# Patient Record
Sex: Male | Born: 1935 | Race: White | Hispanic: No | Smoking: Former smoker
Health system: Southern US, Community
[De-identification: ages and names within clinical notes are randomized; demographics above are authoritative.]

## PROBLEM LIST (undated history)

## (undated) DIAGNOSIS — Z8719 Personal history of other diseases of the digestive system: Secondary | ICD-10-CM

## (undated) DIAGNOSIS — R399 Unspecified symptoms and signs involving the genitourinary system: Secondary | ICD-10-CM

## (undated) DIAGNOSIS — I440 Atrioventricular block, first degree: Secondary | ICD-10-CM

## (undated) DIAGNOSIS — G4733 Obstructive sleep apnea (adult) (pediatric): Secondary | ICD-10-CM

## (undated) DIAGNOSIS — Z9989 Dependence on other enabling machines and devices: Secondary | ICD-10-CM

## (undated) DIAGNOSIS — I10 Essential (primary) hypertension: Secondary | ICD-10-CM

## (undated) DIAGNOSIS — M1990 Unspecified osteoarthritis, unspecified site: Secondary | ICD-10-CM

## (undated) DIAGNOSIS — E785 Hyperlipidemia, unspecified: Secondary | ICD-10-CM

## (undated) DIAGNOSIS — E039 Hypothyroidism, unspecified: Secondary | ICD-10-CM

## (undated) DIAGNOSIS — I2510 Atherosclerotic heart disease of native coronary artery without angina pectoris: Secondary | ICD-10-CM

## (undated) DIAGNOSIS — N400 Enlarged prostate without lower urinary tract symptoms: Secondary | ICD-10-CM

## (undated) DIAGNOSIS — Z951 Presence of aortocoronary bypass graft: Secondary | ICD-10-CM

## (undated) DIAGNOSIS — D519 Vitamin B12 deficiency anemia, unspecified: Secondary | ICD-10-CM

## (undated) DIAGNOSIS — K5909 Other constipation: Secondary | ICD-10-CM

## (undated) DIAGNOSIS — J449 Chronic obstructive pulmonary disease, unspecified: Secondary | ICD-10-CM

## (undated) DIAGNOSIS — N281 Cyst of kidney, acquired: Secondary | ICD-10-CM

## (undated) DIAGNOSIS — I4510 Unspecified right bundle-branch block: Secondary | ICD-10-CM

## (undated) DIAGNOSIS — C679 Malignant neoplasm of bladder, unspecified: Secondary | ICD-10-CM

## (undated) HISTORY — PX: APPENDECTOMY: SHX54

## (undated) HISTORY — PX: CARDIOVASCULAR STRESS TEST: SHX262

## (undated) HISTORY — PX: TONSILLECTOMY: SUR1361

## (undated) HISTORY — PX: CORONARY ARTERY BYPASS GRAFT: SHX141

## (undated) HISTORY — PX: HEMORRHOID SURGERY: SHX153

---

## 1969-06-04 HISTORY — PX: HIATAL HERNIA REPAIR: SHX195

## 2000-10-04 HISTORY — PX: CHOLECYSTECTOMY: SHX55

## 2009-10-04 HISTORY — PX: CATARACT EXTRACTION W/ INTRAOCULAR LENS  IMPLANT, BILATERAL: SHX1307

## 2015-08-05 ENCOUNTER — Encounter: Payer: Self-pay | Admitting: Emergency Medicine

## 2015-08-05 ENCOUNTER — Emergency Department
Admission: EM | Admit: 2015-08-05 | Discharge: 2015-08-05 | Disposition: A | Discharge status: Home / Self Care | Payer: Medicare Other | Attending: Emergency Medicine | Admitting: Emergency Medicine

## 2015-08-05 DIAGNOSIS — K5900 Constipation, unspecified: Secondary | ICD-10-CM | POA: Diagnosis present

## 2015-08-05 DIAGNOSIS — K5641 Fecal impaction: Secondary | ICD-10-CM | POA: Diagnosis not present

## 2015-08-05 HISTORY — DX: Atherosclerotic heart disease of native coronary artery without angina pectoris: I25.10

## 2015-08-05 MED ORDER — POLYETHYLENE GLYCOL 3350 17 G PO PACK: 17.0000 g | PACK | Freq: Every day | ORAL | Status: DC | PRN

## 2015-08-05 NOTE — Discharge Instructions (Signed)
Fecal Impaction °A fecal impaction happens when there is a large, firm amount of stool (or feces) that cannot be passed. The impacted stool is usually in the rectum, which is the lowest part of the large bowel. The impacted stool can block the colon and cause significant problems. °CAUSES  °The longer stool stays in the rectum, the harder it gets. Anything that slows down your bowel movements can lead to fecal impaction, such as: °· Constipation. This can be a long-standing (chronic) problem or can happen suddenly (acute). °· Painful conditions of the rectum, such as hemorrhoids or anal fissures. The pain of these conditions can make you try to avoid having bowel movements. °· Narcotic pain-relieving medicines, such as methadone, morphine, or codeine. °· Not drinking enough fluids. °· Inactivity and bed rest over long periods of time. °· Diseases of the brain or nervous system that damage the nerves controlling the muscles of the intestines. °SIGNS AND SYMPTOMS  °· Lack of normal bowel movements or changes in bowel patterns. °· Sense of fullness in the rectum but unable to pass stool. °· Pain or cramps in the abdominal area (often after meals). °· Thin, watery discharge from the rectum. °DIAGNOSIS  °Your health care provider may suspect that you have a fecal impaction based on your symptoms and a physical exam. This will include an exam of your rectum. Sometimes X-rays or lab testing may be needed to confirm the diagnosis and to be sure there are no other problems.  °TREATMENT  °· Initially an impaction can be removed manually. Using a gloved finger, your health care provider can remove hard stool from your rectum. °· Medicine is sometimes needed. A suppository or enema can be given in the rectum to soften the stool, which can stimulate a bowel movement. Medicines can also be given by mouth (orally). °· Though rare, surgery may be needed if the colon has torn (perforated) due to blockage. °HOME CARE INSTRUCTIONS   °· Develop regular bowel habits. This could include getting in the habit of having a bowel movement after your morning cup of coffee or after eating. Be sure to allow yourself enough time on the toilet. °· Maintain a high-fiber diet. °· Drink enough fluids to keep your urine clear or pale yellow as directed by your health care provider. °· Exercise regularly. °· If you begin to get constipated, increase the amount of fiber in your diet. Eat plenty of fruits, vegetables, whole wheat breads, bran, oatmeal, and similar products. °· Take natural fiber laxatives or other laxatives only as directed by your health care provider. °SEEK MEDICAL CARE IF:  °· You have ongoing rectal pain. °· You require enemas or suppositories more than twice a week. °· You have rectal bleeding. °· You have continued problems, or you develop abdominal pain. °· You have thin, pencil-like stools. °SEEK IMMEDIATE MEDICAL CARE IF:  °You have black or tarry stools. °MAKE SURE YOU:  °· Understand these instructions. °· Will watch your condition. °· Will get help right away if you are not doing well or get worse. °  °This information is not intended to replace advice given to you by your health care provider. Make sure you discuss any questions you have with your health care provider. °  °Document Released: 06/12/2004 Document Revised: 07/11/2013 Document Reviewed: 03/27/2013 °Elsevier Interactive Patient Education ©2016 [employer] ° °

## 2015-08-05 NOTE — ED Provider Notes (Signed)
[facility] Department Provider Note  ____________________________________________  Time seen: Approximately 220 PM  I have reviewed the triage vital signs and the nursing notes.   HISTORY  Chief Complaint Constipation    HPI Jack Castillo is a 81 y.o. male with a history of constipation on Colace who is presenting today without having a normal bowel movement for the past 2 days. He says that he is still able to pass gasand was able to pass some small pellet-like stools. However, he has not able to have his normal bowel movements which she usually has once a day. He says that about a week ago he started taking 250 mg of Colace at night. He is using a [employer] brand. He said this was a change from his usual routine of the twice a day Colace. He denies any change in his diet. He says that he does not really drink water but tracks mostly Gatorade. He denies any nausea vomiting or abdominal pain. He also denies any pain in his rectum. Denies any bright red blood per rectum. He also tried a warm water enema at home today which did not provide him any relief. He said that years ago he had to go to the emergency department [place] where he used to live for constipation where he received an enema which relieved his symptoms.   Past Medical History  Diagnosis Date  . Coronary artery disease   . Hernia, hiatal     There are no active problems to display for this patient.   Past Surgical History  Procedure Laterality Date  . Appendectomy    . Tonsillectomy    . Cardiac surgery    . Cholecystectomy      No current outpatient prescriptions on file.  Allergies Codeine  No family history on file.  Social History Social History  Substance Use Topics  . Smoking status: Never Smoker   . Smokeless tobacco: None  . Alcohol Use: 0.6 oz/week    1 Glasses of wine per week    Review of Systems Constitutional: No fever/chills Eyes: No  visual changes. ENT: No sore throat. Cardiovascular: Denies chest pain. Respiratory: Denies shortness of breath. Gastrointestinal: No abdominal pain.  No nausea, no vomiting.  No diarrhea.   Genitourinary: Negative for dysuria. Musculoskeletal: Negative for back pain. Skin: Negative for rash. Neurological: Negative for headaches, focal weakness or numbness.  10-point ROS otherwise negative.  ____________________________________________   PHYSICAL EXAM:  VITAL SIGNS: ED Triage Vitals  Enc Vitals Group     BP 08/05/15 1319 137/61 mmHg     Pulse Rate 08/05/15 1319 85     Resp 08/05/15 1319 18     Temp 08/05/15 1319 97.8 F (36.6 C)     Temp Source 08/05/15 1319 Oral     SpO2 08/05/15 1319 96 %     Weight 08/05/15 1313 208 lb (94.348 kg)     Height 08/05/15 1313 5\' 8"  (1.727 m)     Head Cir --      Peak Flow --      Pain Score 08/05/15 1314 0     Pain Loc --      Pain Edu? --      Excl. in [place]? --     Constitutional: Alert and oriented. Well appearing and in no acute distress. Eyes: Conjunctivae are normal. EOMI. Head: Atraumatic. Nose: No congestion/rhinnorhea. Mouth/Throat: Mucous membranes are moist.   Neck: No stridor.   Cardiovascular: Normal rate, regular  rhythm. Grossly normal heart sounds.  Good peripheral circulation. Respiratory: Normal respiratory effort.  No retractions. Lungs CTAB. Gastrointestinal: Soft and nontender. No distention. No CVA tenderness. Musculoskeletal: No lower extremity tenderness nor edema.  No joint effusions. Neurologic:  Normal speech and language. No gross focal neurologic deficits are appreciated. No gait instability. Skin:  Skin is warm, dry and intact. No rash noted. Psychiatric: Mood and affect are normal. Speech and behavior are normal.  ____________________________________________   LABS (all labs ordered are listed, but only abnormal results are displayed)  Labs Reviewed - No data to  display ____________________________________________  EKG   ____________________________________________  RADIOLOGY   ____________________________________________   PROCEDURES  ------------------------------------------------------------------------------------------------------------------- Fecal Disimpaction Procedure Note:  Performed by me:  Patient placed in the lateral recumbent position with knees drawn towards chest.  Small to moderate amount of hard stool high up in the rectum. Was able to remove a small amount. The patient tolerated the procedure well. The stool was brown without any gross blood. No complications during procedure.   ------------------------------------------------------------------------------------------------------------------    ____________________________________________   INITIAL IMPRESSION / ASSESSMENT AND PLAN / ED COURSE  Pertinent labs & imaging results that were available during my care of the patient were reviewed by me and considered in my medical decision making (see chart for details).  ----------------------------------------- 2:56 PM on 08/05/2015 -----------------------------------------  The patient was able to have a moderate sized bowel movement on his own after I disimpacted him. Since he is on quite a high dose of Colace at home and is not providing any relief, I will switch him to MiraLAX.  We discussed return precautions such as those of a bowel obstruction including abdominal distention, pain nausea vomiting and not passing stool and gas. Patient understands the plan and is going to comply.  ____________________________________________   FINAL CLINICAL IMPRESSION(S) / ED DIAGNOSES  Fecal impaction.     Orbie Pyo, MD 08/05/15 [phone]

## 2015-08-05 NOTE — ED Notes (Signed)
Pt states he has not had a BM in 2 days, gave self an enemia today with no relief, denies any abd. Pain or n,v

## 2015-08-05 NOTE — ED Notes (Signed)
Pt to ED due to no Bowel movement in 2 days despite enema this morning.

## 2015-09-02 ENCOUNTER — Other Ambulatory Visit (HOSPITAL_COMMUNITY): Payer: Self-pay | Admitting: Urology

## 2015-09-02 DIAGNOSIS — N289 Disorder of kidney and ureter, unspecified: Secondary | ICD-10-CM

## 2015-09-05 ENCOUNTER — Other Ambulatory Visit: Payer: Self-pay | Admitting: Urology

## 2015-09-12 ENCOUNTER — Ambulatory Visit (HOSPITAL_COMMUNITY)
Admission: RE | Admit: 2015-09-12 | Discharge: 2015-09-12 | Disposition: A | Discharge status: Home / Self Care | Payer: Medicare Other | Source: Ambulatory Visit | Attending: Urology | Admitting: Urology

## 2015-09-12 DIAGNOSIS — N289 Disorder of kidney and ureter, unspecified: Secondary | ICD-10-CM | POA: Diagnosis not present

## 2015-09-12 MED ORDER — GADOBENATE DIMEGLUMINE 529 MG/ML IV SOLN
20.0000 mL | Freq: Once | INTRAVENOUS | Status: AC | PRN
  Administered 2015-09-12: 18 mL via INTRAVENOUS

## 2015-09-22 ENCOUNTER — Encounter (HOSPITAL_BASED_OUTPATIENT_CLINIC_OR_DEPARTMENT_OTHER): Payer: Self-pay | Admitting: *Deleted

## 2015-09-22 NOTE — Progress Notes (Addendum)
NPO AFTER MN.  ARRIVE AT 0930.  NEEDS ISTAT.  CURRENT EKG, LOV NOTE, AND STRESS TEST in epic under Care Everywhere tab.  WILL TAKE SYNTHROID AND DO SYMBICORT INHALER/ NEBULIZER AM DOS W/ SIPS OF WATER.

## 2015-09-25 ENCOUNTER — Encounter (HOSPITAL_BASED_OUTPATIENT_CLINIC_OR_DEPARTMENT_OTHER): Payer: Self-pay | Admitting: *Deleted

## 2015-09-25 ENCOUNTER — Encounter (HOSPITAL_BASED_OUTPATIENT_CLINIC_OR_DEPARTMENT_OTHER)
Admission: RE | Disposition: A | Discharge status: Home / Self Care | Payer: Self-pay | Source: Ambulatory Visit | Attending: Urology

## 2015-09-25 ENCOUNTER — Ambulatory Visit (HOSPITAL_BASED_OUTPATIENT_CLINIC_OR_DEPARTMENT_OTHER)
Admission: RE | Admit: 2015-09-25 | Discharge: 2015-09-25 | Disposition: A | Discharge status: Home / Self Care | Payer: Medicare Other | Source: Ambulatory Visit | Attending: Urology | Admitting: Urology

## 2015-09-25 ENCOUNTER — Ambulatory Visit (HOSPITAL_BASED_OUTPATIENT_CLINIC_OR_DEPARTMENT_OTHER): Payer: Medicare Other | Admitting: Anesthesiology

## 2015-09-25 DIAGNOSIS — E039 Hypothyroidism, unspecified: Secondary | ICD-10-CM | POA: Diagnosis not present

## 2015-09-25 DIAGNOSIS — N401 Enlarged prostate with lower urinary tract symptoms: Secondary | ICD-10-CM | POA: Diagnosis not present

## 2015-09-25 DIAGNOSIS — Z951 Presence of aortocoronary bypass graft: Secondary | ICD-10-CM | POA: Insufficient documentation

## 2015-09-25 DIAGNOSIS — N281 Cyst of kidney, acquired: Secondary | ICD-10-CM | POA: Insufficient documentation

## 2015-09-25 DIAGNOSIS — R3912 Poor urinary stream: Secondary | ICD-10-CM | POA: Insufficient documentation

## 2015-09-25 DIAGNOSIS — J45909 Unspecified asthma, uncomplicated: Secondary | ICD-10-CM | POA: Diagnosis not present

## 2015-09-25 DIAGNOSIS — D649 Anemia, unspecified: Secondary | ICD-10-CM | POA: Diagnosis not present

## 2015-09-25 DIAGNOSIS — Z9989 Dependence on other enabling machines and devices: Secondary | ICD-10-CM | POA: Insufficient documentation

## 2015-09-25 DIAGNOSIS — K449 Diaphragmatic hernia without obstruction or gangrene: Secondary | ICD-10-CM | POA: Diagnosis not present

## 2015-09-25 DIAGNOSIS — I2510 Atherosclerotic heart disease of native coronary artery without angina pectoris: Secondary | ICD-10-CM | POA: Diagnosis not present

## 2015-09-25 DIAGNOSIS — R3914 Feeling of incomplete bladder emptying: Secondary | ICD-10-CM | POA: Insufficient documentation

## 2015-09-25 DIAGNOSIS — G4730 Sleep apnea, unspecified: Secondary | ICD-10-CM | POA: Insufficient documentation

## 2015-09-25 DIAGNOSIS — D494 Neoplasm of unspecified behavior of bladder: Secondary | ICD-10-CM | POA: Diagnosis present

## 2015-09-25 DIAGNOSIS — Z87891 Personal history of nicotine dependence: Secondary | ICD-10-CM | POA: Insufficient documentation

## 2015-09-25 DIAGNOSIS — J449 Chronic obstructive pulmonary disease, unspecified: Secondary | ICD-10-CM | POA: Insufficient documentation

## 2015-09-25 DIAGNOSIS — E7800 Pure hypercholesterolemia, unspecified: Secondary | ICD-10-CM | POA: Diagnosis not present

## 2015-09-25 DIAGNOSIS — M1990 Unspecified osteoarthritis, unspecified site: Secondary | ICD-10-CM | POA: Diagnosis not present

## 2015-09-25 DIAGNOSIS — R350 Frequency of micturition: Secondary | ICD-10-CM | POA: Diagnosis not present

## 2015-09-25 DIAGNOSIS — C670 Malignant neoplasm of trigone of bladder: Secondary | ICD-10-CM | POA: Insufficient documentation

## 2015-09-25 DIAGNOSIS — I129 Hypertensive chronic kidney disease with stage 1 through stage 4 chronic kidney disease, or unspecified chronic kidney disease: Secondary | ICD-10-CM | POA: Insufficient documentation

## 2015-09-25 DIAGNOSIS — Z7982 Long term (current) use of aspirin: Secondary | ICD-10-CM | POA: Insufficient documentation

## 2015-09-25 DIAGNOSIS — R3915 Urgency of urination: Secondary | ICD-10-CM | POA: Diagnosis not present

## 2015-09-25 DIAGNOSIS — Z79899 Other long term (current) drug therapy: Secondary | ICD-10-CM | POA: Diagnosis not present

## 2015-09-25 DIAGNOSIS — R351 Nocturia: Secondary | ICD-10-CM | POA: Diagnosis not present

## 2015-09-25 DIAGNOSIS — N183 Chronic kidney disease, stage 3 (moderate): Secondary | ICD-10-CM | POA: Insufficient documentation

## 2015-09-25 DIAGNOSIS — Z7951 Long term (current) use of inhaled steroids: Secondary | ICD-10-CM | POA: Insufficient documentation

## 2015-09-25 HISTORY — PX: TRANSURETHRAL RESECTION OF BLADDER TUMOR WITH GYRUS (TURBT-GYRUS): SHX6458

## 2015-09-25 HISTORY — DX: Unspecified osteoarthritis, unspecified site: M19.90

## 2015-09-25 HISTORY — DX: Essential (primary) hypertension: I10

## 2015-09-25 HISTORY — DX: Atrioventricular block, first degree: I44.0

## 2015-09-25 HISTORY — DX: Unspecified right bundle-branch block: I45.10

## 2015-09-25 HISTORY — DX: Other constipation: K59.09

## 2015-09-25 HISTORY — DX: Dependence on other enabling machines and devices: Z99.89

## 2015-09-25 HISTORY — DX: Unspecified symptoms and signs involving the genitourinary system: R39.9

## 2015-09-25 HISTORY — DX: Vitamin B12 deficiency anemia, unspecified: D51.9

## 2015-09-25 HISTORY — DX: Presence of aortocoronary bypass graft: Z95.1

## 2015-09-25 HISTORY — DX: Chronic obstructive pulmonary disease, unspecified: J44.9

## 2015-09-25 HISTORY — DX: Benign prostatic hyperplasia without lower urinary tract symptoms: N40.0

## 2015-09-25 HISTORY — DX: Cyst of kidney, acquired: N28.1

## 2015-09-25 HISTORY — DX: Obstructive sleep apnea (adult) (pediatric): G47.33

## 2015-09-25 HISTORY — DX: Personal history of other diseases of the digestive system: Z87.19

## 2015-09-25 HISTORY — DX: Hypothyroidism, unspecified: E03.9

## 2015-09-25 HISTORY — DX: Hyperlipidemia, unspecified: E78.5

## 2015-09-25 LAB — POCT I-STAT 4, (NA,K, GLUC, HGB,HCT)
Glucose, Bld: 91 mg/dL (ref 65–99)
HCT: 35 % — ABNORMAL LOW (ref 39.0–52.0)
HEMOGLOBIN: 11.9 g/dL — AB (ref 13.0–17.0)
POTASSIUM: 3.9 mmol/L (ref 3.5–5.1)
Sodium: 142 mmol/L (ref 135–145)

## 2015-09-25 SURGERY — TRANSURETHRAL RESECTION OF BLADDER TUMOR WITH GYRUS (TURBT-GYRUS)
Anesthesia: General | Site: Bladder

## 2015-09-25 MED ORDER — FENTANYL CITRATE (PF) 100 MCG/2ML IJ SOLN
INTRAMUSCULAR | Status: AC
  Filled 2015-09-25: qty 2

## 2015-09-25 MED ORDER — CEFAZOLIN SODIUM-DEXTROSE 2-3 GM-% IV SOLR
INTRAVENOUS | Status: AC
  Filled 2015-09-25: qty 50

## 2015-09-25 MED ORDER — PROMETHAZINE HCL 25 MG/ML IJ SOLN
6.2500 mg | INTRAMUSCULAR | Status: DC | PRN
  Filled 2015-09-25: qty 1

## 2015-09-25 MED ORDER — DEXAMETHASONE SODIUM PHOSPHATE 10 MG/ML IJ SOLN
INTRAMUSCULAR | Status: AC
  Filled 2015-09-25: qty 1

## 2015-09-25 MED ORDER — ONDANSETRON HCL 4 MG/2ML IJ SOLN
INTRAMUSCULAR | Status: AC
  Filled 2015-09-25: qty 2

## 2015-09-25 MED ORDER — GLYCOPYRROLATE 0.2 MG/ML IJ SOLN
INTRAMUSCULAR | Status: AC
  Filled 2015-09-25: qty 1

## 2015-09-25 MED ORDER — GLYCOPYRROLATE 0.2 MG/ML IJ SOLN
INTRAMUSCULAR | Status: DC | PRN
  Administered 2015-09-25: .2 mg via INTRAVENOUS

## 2015-09-25 MED ORDER — LIDOCAINE HCL (CARDIAC) 20 MG/ML IV SOLN
INTRAVENOUS | Status: DC | PRN
  Administered 2015-09-25: 50 mg via INTRAVENOUS

## 2015-09-25 MED ORDER — FENTANYL CITRATE (PF) 100 MCG/2ML IJ SOLN
25.0000 ug | INTRAMUSCULAR | Status: DC | PRN
  Filled 2015-09-25: qty 1

## 2015-09-25 MED ORDER — CEFAZOLIN SODIUM-DEXTROSE 2-3 GM-% IV SOLR
2.0000 g | INTRAVENOUS | Status: AC
  Administered 2015-09-25: 2 g via INTRAVENOUS
  Filled 2015-09-25: qty 50

## 2015-09-25 MED ORDER — LACTATED RINGERS IV SOLN
INTRAVENOUS | Status: DC
  Administered 2015-09-25 (×2): via INTRAVENOUS
  Filled 2015-09-25: qty 1000

## 2015-09-25 MED ORDER — LIDOCAINE HCL (CARDIAC) 20 MG/ML IV SOLN
INTRAVENOUS | Status: AC
  Filled 2015-09-25: qty 5

## 2015-09-25 MED ORDER — ACETAMINOPHEN 500 MG PO TABS
500.0000 mg | ORAL_TABLET | Freq: Four times a day (QID) | ORAL | Status: DC | PRN
  Administered 2015-09-25: 500 mg via ORAL
  Filled 2015-09-25: qty 1

## 2015-09-25 MED ORDER — KETOROLAC TROMETHAMINE 30 MG/ML IJ SOLN
INTRAMUSCULAR | Status: DC | PRN
  Administered 2015-09-25: 30 mg via INTRAVENOUS

## 2015-09-25 MED ORDER — MELOXICAM 15 MG PO TABS: 15.0000 mg | ORAL_TABLET | Freq: Every day | ORAL | Status: AC

## 2015-09-25 MED ORDER — ONDANSETRON HCL 4 MG/2ML IJ SOLN
INTRAMUSCULAR | Status: DC | PRN
  Administered 2015-09-25: 4 mg via INTRAVENOUS

## 2015-09-25 MED ORDER — KETOROLAC TROMETHAMINE 30 MG/ML IJ SOLN
INTRAMUSCULAR | Status: AC
  Filled 2015-09-25: qty 1

## 2015-09-25 MED ORDER — SODIUM CHLORIDE 0.9 % IR SOLN
Status: DC | PRN
  Administered 2015-09-25: 3000 mL

## 2015-09-25 MED ORDER — ACETAMINOPHEN 500 MG PO TABS
ORAL_TABLET | ORAL | Status: AC
  Filled 2015-09-25: qty 1

## 2015-09-25 MED ORDER — EPHEDRINE SULFATE 50 MG/ML IJ SOLN
INTRAMUSCULAR | Status: DC | PRN
  Administered 2015-09-25: 10 mg via INTRAVENOUS

## 2015-09-25 MED ORDER — FENTANYL CITRATE (PF) 100 MCG/2ML IJ SOLN
INTRAMUSCULAR | Status: DC | PRN
  Administered 2015-09-25 (×4): 25 ug via INTRAVENOUS

## 2015-09-25 MED ORDER — PROPOFOL 10 MG/ML IV BOLUS
INTRAVENOUS | Status: AC
  Filled 2015-09-25: qty 20

## 2015-09-25 MED ORDER — EPHEDRINE SULFATE 50 MG/ML IJ SOLN
INTRAMUSCULAR | Status: AC
  Filled 2015-09-25: qty 1

## 2015-09-25 MED ORDER — TRIMETHOPRIM 100 MG PO TABS: 100.0000 mg | ORAL_TABLET | ORAL | Status: DC

## 2015-09-25 MED ORDER — STERILE WATER FOR IRRIGATION IR SOLN
Status: DC | PRN
  Administered 2015-09-25: 3000 mL

## 2015-09-25 MED ORDER — PROPOFOL 10 MG/ML IV BOLUS
INTRAVENOUS | Status: DC | PRN
  Administered 2015-09-25: 120 mg via INTRAVENOUS

## 2015-09-25 MED ORDER — UROGESIC-BLUE 81.6 MG PO TABS: ORAL_TABLET | ORAL | Status: AC

## 2015-09-25 MED ORDER — DEXAMETHASONE SODIUM PHOSPHATE 4 MG/ML IJ SOLN
INTRAMUSCULAR | Status: DC | PRN
  Administered 2015-09-25: 10 mg via INTRAVENOUS

## 2015-09-25 SURGICAL SUPPLY — 35 items
BAG DRAIN URO-CYSTO SKYTR STRL (DRAIN) ×2 IMPLANT
BAG URINE DRAINAGE (UROLOGICAL SUPPLIES) ×2 IMPLANT
BAG URINE LEG 19OZ MD ST LTX (BAG) IMPLANT
BOOTIES KNEE HIGH SLOAN (MISCELLANEOUS) ×2 IMPLANT
CATH FOLEY 2WAY SLVR  5CC 18FR (CATHETERS) ×1
CATH FOLEY 2WAY SLVR  5CC 20FR (CATHETERS)
CATH FOLEY 2WAY SLVR  5CC 22FR (CATHETERS)
CATH FOLEY 2WAY SLVR 5CC 18FR (CATHETERS) ×1 IMPLANT
CATH FOLEY 2WAY SLVR 5CC 20FR (CATHETERS) IMPLANT
CATH FOLEY 2WAY SLVR 5CC 22FR (CATHETERS) IMPLANT
CLOTH BEACON ORANGE TIMEOUT ST (SAFETY) ×2 IMPLANT
ELECT REM PT RETURN 9FT ADLT (ELECTROSURGICAL) ×4
ELECTRODE REM PT RTRN 9FT ADLT (ELECTROSURGICAL) ×2 IMPLANT
GLOVE BIO SURGEON STRL SZ 6.5 (GLOVE) ×2 IMPLANT
GLOVE BIO SURGEON STRL SZ7.5 (GLOVE) ×2 IMPLANT
GLOVE BIOGEL PI IND STRL 6.5 (GLOVE) ×1 IMPLANT
GLOVE BIOGEL PI INDICATOR 6.5 (GLOVE) ×1
GOWN STRL REUS W/ TWL LRG LVL3 (GOWN DISPOSABLE) ×1 IMPLANT
GOWN STRL REUS W/ TWL XL LVL3 (GOWN DISPOSABLE) ×1 IMPLANT
GOWN STRL REUS W/TWL LRG LVL3 (GOWN DISPOSABLE) ×1
GOWN STRL REUS W/TWL XL LVL3 (GOWN DISPOSABLE) ×1
HOLDER FOLEY CATH W/STRAP (MISCELLANEOUS) ×2 IMPLANT
IV NS 1000ML (IV SOLUTION) ×1
IV NS 1000ML BAXH (IV SOLUTION) ×1 IMPLANT
IV NS IRRIG 3000ML ARTHROMATIC (IV SOLUTION) ×2 IMPLANT
KIT ROOM TURNOVER WOR (KITS) ×2 IMPLANT
LOOP CUT BIPOLAR 24F LRG (ELECTROSURGICAL) ×2 IMPLANT
MANIFOLD NEPTUNE II (INSTRUMENTS) ×2 IMPLANT
PACK CYSTO (CUSTOM PROCEDURE TRAY) ×2 IMPLANT
PLUG CATH AND CAP STER (CATHETERS) IMPLANT
SET ASPIRATION TUBING (TUBING) IMPLANT
SYRINGE IRR TOOMEY STRL 70CC (SYRINGE) IMPLANT
TUBE CONNECTING 12X1/4 (SUCTIONS) ×2 IMPLANT
WATER STERILE IRR 3000ML UROMA (IV SOLUTION) ×2 IMPLANT
WATER STERILE IRR 500ML POUR (IV SOLUTION) ×2 IMPLANT

## 2015-09-25 NOTE — Transfer of Care (Signed)
Immediate Anesthesia Transfer of Care Note  Patient: Jack Castillo  Procedure(s) Performed: Procedure(s) (LRB): TRANSURETHRAL RESECTION OF BLADDER TUMOR WITH GYRUS (TURBT-GYRUS) X 2, 3 CM RIGHT BLADDER BASE. RIGHT BLADDER TRIGONE 2 CM (N/A)  Patient Location: PACU  Anesthesia Type: General  Level of Consciousness: awake, oriented, sedated and patient cooperative  Airway & Oxygen Therapy: Patient Spontanous Breathing and Patient connected to face mask oxygen  Post-op Assessment: Report given to PACU RN and Post -op Vital signs reviewed and stable  Post vital signs: Reviewed and stable  Complications: No apparent anesthesia complications

## 2015-09-25 NOTE — Anesthesia Preprocedure Evaluation (Signed)
Anesthesia Evaluation  Patient identified by MRN, date of birth, ID band Patient awake    Reviewed: Allergy & Precautions, NPO status , Patient's Chart, lab work & pertinent test results  Airway Mallampati: II  TM Distance: >3 FB Neck ROM: Full    Dental no notable dental hx.    Pulmonary sleep apnea and Continuous Positive Airway Pressure Ventilation , COPD,  COPD inhaler, former smoker,    Pulmonary exam normal breath sounds clear to auscultation       Cardiovascular Exercise Tolerance: Good hypertension, Pt. on medications + CAD  Normal cardiovascular exam+ dysrhythmias  Rhythm:Regular Rate:Normal     Neuro/Psych negative neurological ROS  negative psych ROS   GI/Hepatic Neg liver ROS, hiatal hernia,   Endo/Other  Hypothyroidism   Renal/GU Renal disease  negative genitourinary   Musculoskeletal  (+) Arthritis ,   Abdominal   Peds negative pediatric ROS (+)  Hematology  (+) anemia ,   Anesthesia Other Findings   Reproductive/Obstetrics negative OB ROS                             Anesthesia Physical Anesthesia Plan  ASA: III  Anesthesia Plan: General   Post-op Pain Management:    Induction: Intravenous  Airway Management Planned: LMA  Additional Equipment:   Intra-op Plan:   Post-operative Plan: Extubation in OR  Informed Consent: I have reviewed the patients History and Physical, chart, labs and discussed the procedure including the risks, benefits and alternatives for the proposed anesthesia with the patient or authorized representative who has indicated his/her understanding and acceptance.   Dental advisory given  Plan Discussed with: CRNA  Anesthesia Plan Comments:         Anesthesia Quick Evaluation

## 2015-09-25 NOTE — Interval H&P Note (Signed)
History and Physical Interval Note:  09/25/2015 10:14 AM  Jack Castillo  has presented today for surgery, with the diagnosis of BLADDER TUMOR  The various methods of treatment have been discussed with the patient and family. After consideration of risks, benefits and other options for treatment, the patient has consented to  Procedure(s): TRANSURETHRAL RESECTION OF BLADDER TUMOR WITH GYRUS (TURBT-GYRUS) (N/A) as a surgical intervention .  The patient's history has been reviewed, patient examined, no change in status, stable for surgery.  I have reviewed the patient's chart and labs.  Questions were answered to the patient's satisfaction.     Iden Stripling I Siraj Dermody

## 2015-09-25 NOTE — Anesthesia Procedure Notes (Signed)
Procedure Name: LMA Insertion Date/Time: 09/25/2015 12:07 PM Performed by: Denna Haggard D Pre-anesthesia Checklist: Patient identified, Emergency Drugs available, Suction available and Patient being monitored Patient Re-evaluated:Patient Re-evaluated prior to inductionOxygen Delivery Method: Circle System Utilized Preoxygenation: Pre-oxygenation with 100% oxygen Intubation Type: IV induction Ventilation: Mask ventilation without difficulty LMA: LMA inserted LMA Size: 5.0 Number of attempts: 1 Airway Equipment and Method: bite block Placement Confirmation: positive ETCO2 Tube secured with: Tape Dental Injury: Teeth and Oropharynx as per pre-operative assessment

## 2015-09-25 NOTE — Discharge Instructions (Addendum)
Bladder Cancer Bladder cancer is an abnormal growth of tissue in your bladder. Your bladder is the balloon-like sac in your pelvis. It collects and stores urine that comes from the kidneys through the ureters. The bladder wall is made of layers. If cancer spreads into these layers and through the wall of the bladder, it becomes more difficult to treat.  There are four stages of bladder cancer:  Stage I. Cancer at this stage occurs in the bladder's inner lining but has not invaded the muscular bladder wall.  Stage II. At this stage, cancer has invaded the bladder wall but is still confined to the bladder.  Stage III. By this stage, the cancer cells have spread through the bladder wall to surrounding tissue. They may also have spread to the prostate in men or the uterus or vagina in women.  Stage IV. By this stage, cancer cells may have spread to the lymph nodes and other organs, such as your lungs, bones, or liver. RISK FACTORS Although the cause of bladder cancer is not known, the following risk factors can increase your chances of getting bladder cancer:   Smoking.   Occupational exposures, such as rubber, leather, textile, dyes, chemicals, and paint.  Being white.  Age.   Being male.   Having chronic bladder inflammation.   Having a bladder cancer history.   Having a family history of bladder cancer (heredity).   Having had chemotherapy or radiation therapy to the pelvis.   Being exposed to arsenic.  SYMPTOMS   Blood in the urine.   Pain with urination.   Frequent bladder or urine infections.  Increase in urgency and frequency of urination. DIAGNOSIS  Your health care provider may suspect bladder cancer based on your description of urinary symptoms or based on the finding of blood or infection in the urine (especially if this has recurred several times). Other tests or procedures that may be performed include:   A narrow tube being inserted into your bladder  through your urethra (cystoscopy) in order to view the lining of your bladder for tumors.   A biopsy to sample the tumor to see if cancer is present.  If cancer is present, it will then be staged to determine its severity and extent. It is important to know how deeply into the bladder wall the cancer has grown and whether the cancer has spread to any other parts of your body. Staging may require blood tests or special scans such as a CT scan, MRI, bone scan, or chest X-ray.  TREATMENT  Once your cancer has been diagnosed and staged, you should discuss a treatment plan with your health care provider. Based on the stage of the cancer, one treatment or a combination of treatments may be recommended. The most common forms of treatment are:   Surgery. Procedures that may be done include transurethral resection and cystectomy.  Radiation therapy. This is infrequently used to treat bladder cancer.   Chemotherapy. During this treatment, drugs are used to kill cancer cells.  Immunotherapy. This is usually administered directly into the bladder. HOME CARE INSTRUCTIONS  Take medicines only as directed by your health care provider.   Maintain a healthy diet.   Consider joining a support group. This may help you learn to cope with the stress of having bladder cancer.   Seek advice to help you manage treatment side effects.   Keep all follow-up visits as directed by your health care provider.   Inform your cancer specialist if you are  admitted to the hospital.  [place] IF:  There is blood in your urine.  You have symptoms of a urinary tract infection. These include:  Tiredness.  Shakiness.  Weakness.  Muscle aches.  Abdominal pain.  Frequent and intense urge to urinate (in young women).  Burning feeling in the bladder or urethra during urination (in young women).                                                                 Transurethral Resection, Bladder Tumor    A cancerous growth (tumor) can develop on the inside wall of the bladder. The bladder is the organ that holds urine. One way to remove the tumor is a procedure called a transurethral resection. The tumor is removed (resected) through the tube that carries urine from the bladder out of the body (urethra). No cuts (incisions) are made in the skin. Instead, the procedure is done through a thin telescope, called a resectoscope. Attached to it is a light and usually a tiny camera. The resectoscope is put into the urethra. In men, the urethra opens at the end of the penis. In women, it opens just above the vagina.  A transurethral resection is usually used to remove tumors that have not gotten too big or too deep. These are called Stage 0, Stage 1 or Stage 2 bladder cancers. LET YOUR CAREGIVER KNOW ABOUT:  On the day of the procedure, your caregivers will need to know the last time you had anything to eat or drink. This includes water, gum, and candy. In advance, make sure they know about:   Any allergies.  All medications you are taking, including:  Herbs, eyedrops, over-the-counter medications and creams.  Blood thinners (anticoagulants), aspirin or other drugs that could affect blood clotting.  Use of steroids (by mouth or as creams).  Previous problems with anesthetics, including local anesthetics.  Possibility of pregnancy, if this applies.  Any history of blood clots.  Any history of bleeding or other blood problems.  Previous surgery.  Smoking history.  Any recent symptoms of colds or infections.  Other health problems. RISKS AND COMPLICATIONS This is usually a safe procedure. Every procedure has risks, though. For a transurethral resection, they include:  Infection. Antibiotic medication would need to be taken.  Bleeding.  Light bleeding may last for several days after the procedure.  If bleeding continues or is heavy, the bladder may need rinsing. Or, a new catheter  might be put in for awhile.  Sometimes bed rest is needed.  Urination problems.  Pain and burning can occur when urinating. This usually goes away in a few days.  Scarring from the procedure can block the flow of urine.  Bladder damage.  It can be punctured or torn during removal of the tumor. If this happens, a catheter might be needed for longer. Antibiotics would be taken while the bladder heals.  Urine can leak through the hole or tear into the abdomen. If this happens, surgery may be needed to repair the bladder. BEFORE THE PROCEDURE   A medical evaluation will be done. This may include:  A physical examination.  Urine test. This is to make sure you do not have a urinary tract infection.  Blood tests.  A test  that checks the heart's rhythm (electrocardiogram).  Talking with an anesthesiologist. This is the person who will be in charge of the medication (anesthesia) to keep you from feeling pain during the transurethral resection. You might be asleep during the procedure (general anesthesia) or numb from the waist down, but awake during the procedure (spinal anesthesia). Ask your surgeon what to expect.  The person who is having a transurethral resection needs to give what is called informed consent. This requires signing a legal paper that gives permission for the procedure. To give informed consent:  You must understand how the procedure is done and why.  You must be told all the risks and benefits of the procedure.  You must sign the consent. Sometimes a legal guardian can do this.  Signing should be witnessed by a healthcare professional.  The day before the surgery, eat only a light dinner. Then, do not eat or drink anything for at least 8 hours before the surgery. Ask your caregiver if it is OK to take any needed medicines with a sip of water.  Arrive at least an hour before the surgery or whenever your surgeon recommends. This will give you time to check in and  fill out any needed paperwork. PROCEDURE  The preparation:  You will change into a hospital gown.  A needle will be inserted in your arm. This is an intravenous access tube (IV). Medication will be able to flow directly into your body through this needle.  Small monitors will be put on your body. They are used to check your heart, blood pressure, and oxygen level.  You might be given medication that will help you relax (sedative).  You will be given a general anesthetic or spinal anesthesia.  The procedure:  Once you are asleep or numb from the waist down, your legs will be placed in stirrups.  The resectoscope will be passed through the urethra into the bladder.  Fluid will be passed through the resectoscope. This will fill the bladder with water.  The surgeon will examine the bladder through the scope. If the scope has a camera, it can take pictures from inside the bladder. They can be projected onto a TV screen.  The surgeon will use various tools to remove the tumor in small pieces. Sometimes a laser (a beam of light energy) is used. Other tools may use electric current.  A tube (catheter) will often be placed so that urine can drain into a bag outside the body. This process helps stop bleeding. This tube keeps blood clots from blocking the urethra.  The procedure usually takes 30 to 45 minutes. AFTER THE PROCEDURE   You will stay in a recovery area until the anesthesia has worn off. Your blood pressure and pulse will be checked every so often. Then you will be taken to a hospital room.  You may continue to get fluids through the IV for awhile.  Some pain is normal. The catheter might be uncomfortable. Pain is usually not severe. If it is, ask for pain medicine.  Your urine may look bloody after a transurethral resection. This is normal.  If bleeding is heavy, a hospital caregiver may rinse out the bladder (irrigation) through the catheter.  Once the urine is clear, the  catheter will be taken out.  You will need to stay in the hospital until you can urinate on your own.  Most people stay in the hospital for up to 4 days. PROGNOSIS   Transurethral resection is considered  the best way to treat bladder tumors that are not too far along. For most people, the treatment is successful. Sometimes, though, more treatment is needed.  Bladder cancers can come back even after a successful procedure. Because of this, be sure to have a checkup with your caregiver every 3 to 6 months. If everything is OK for 3 years, you can reduce the checkups to once a year.   This information is not intended to replace advice given to you by your health care provider. Make sure you discuss any questions you have with your health care provider.   Post Anesthesia Home Care Instructions  Activity: Get plenty of rest for the remainder of the day. A responsible adult should stay with you for 24 hours following the procedure.  For the next 24 hours, DO NOT: -Drive a car -Paediatric nurse -Drink alcoholic beverages -Take any medication unless instructed by your physician -Make any legal decisions or sign important papers.  Meals: Start with liquid foods such as gelatin or soup. Progress to regular foods as tolerated. Avoid greasy, spicy, heavy foods. If nausea and/or vomiting occur, drink only clear liquids until the nausea and/or vomiting subsides. Call your physician if vomiting continues.  Special Instructions/Symptoms: Your throat may feel dry or sore from the anesthesia or the breathing tube placed in your throat during surgery. If this causes discomfort, gargle with warm salt water. The discomfort should disappear within 24 hours.  If you had a scopolamine patch placed behind your ear for the management of post- operative nausea and/or vomiting:  1. The medication in the patch is effective for 72 hours, after which it should be removed.  Wrap patch in a tissue and discard in  the trash. Wash hands thoroughly with soap and water. 2. You may remove the patch earlier than 72 hours if you experience unpleasant side effects which may include dry mouth, dizziness or visual disturbances. 3. Avoid touching the patch. Wash your hands with soap and water after contact with the patch.                                                                                                                                 Foley Catheter Care  A Foley catheter is a soft, flexible tube that is placed into the bladder to drain urine. A Foley catheter may be inserted if:  You leak urine or are not able to control when you urinate (urinary incontinence).  You are not able to urinate when you need to (urinary retention).  You had prostate surgery or surgery on the genitals.  You have certain medical conditions, such as multiple sclerosis, dementia, or a spinal cord injury. If you are going home with a Foley catheter in place, follow the instructions below. TAKING CARE OF THE CATHETER  Wash your hands with soap and water.  Using mild soap and warm water on a clean washcloth:  Clean the area on  your body closest to the catheter insertion site using a circular motion, moving away from the catheter. Never wipe toward the catheter because this could sweep bacteria up into the urethra and cause infection.  Remove all traces of soap. Pat the area dry with a clean towel. For males, reposition the foreskin.  Attach the catheter to your leg so there is no tension on the catheter. Use adhesive tape or a leg strap. If you are using adhesive tape, remove any sticky residue left behind by the previous tape you used.  Keep the drainage bag below the level of the bladder, but keep it off the floor.  Check throughout the day to be sure the catheter is working and urine is draining freely. Make sure the tubing does not become kinked.  Do not pull on the catheter or try to remove it. Pulling could  damage internal tissues. TAKING CARE OF THE DRAINAGE BAGS You will be given two drainage bags to take home. One is a large overnight drainage bag, and the other is a smaller leg bag that fits underneath clothing. You may wear the overnight bag at any time, but you should never wear the smaller leg bag at night. Follow the instructions below for how to empty, change, and clean your drainage bags. Emptying the Drainage Bag You must empty your drainage bag when it is  - full or at least 2-3 times a day.  Wash your hands with soap and water.  Keep the drainage bag below your hips, below the level of your bladder. This stops urine from going back into the tubing and into your bladder.  Hold the dirty bag over the toilet or a clean container.  Open the pour spout at the bottom of the bag and empty the urine into the toilet or container. Do not let the pour spout touch the toilet, container, or any other surface. Doing so can place bacteria on the bag, which can cause an infection.  Clean the pour spout with a gauze pad or cotton ball that has rubbing alcohol on it.  Close the pour spout.  Attach the bag to your leg with adhesive tape or a leg strap.  Wash your hands well. Changing the Drainage Bag Change your drainage bag once a month or sooner if it starts to smell bad or look dirty. Below are steps to follow when changing the drainage bag.  Wash your hands with soap and water.  Pinch off the rubber catheter so that urine does not spill out.  Disconnect the catheter tube from the drainage tube at the connection valve. Do not let the tubes touch any surface.  Clean the end of the catheter tube with an alcohol wipe. Use a different alcohol wipe to clean the end of the drainage tube.  Connect the catheter tube to the drainage tube of the clean drainage bag.  Attach the new bag to the leg with adhesive tape or a leg strap. Avoid attaching the new bag too tightly.  Wash your hands  well. Cleaning the Drainage Bag  Wash your hands with soap and water.  Wash the bag in warm, soapy water.  Rinse the bag thoroughly with warm water.  Fill the bag with a solution of white vinegar and water (1 cup vinegar to 1 qt warm water [.2 L vinegar to 1 L warm water]). Close the bag and soak it for 30 minutes in the solution.  Rinse the bag with warm water.  Hang the  bag to dry with the pour spout open and hanging downward.  Store the clean bag (once it is dry) in a clean plastic bag.  Wash your hands well. PREVENTING INFECTION  Wash your hands before and after handling your catheter.  Take showers daily and wash the area where the catheter enters your body. Do not take baths. Replace wet leg straps with dry ones, if this applies.  Do not use powders, sprays, or lotions on the genital area. Only use creams, lotions, or ointments as directed by your caregiver.  For females, wipe from front to back after each bowel movement.  Drink enough fluids to keep your urine clear or pale yellow unless you have a fluid restriction.  Do not let the drainage bag or tubing touch or lie on the floor.  Wear cotton underwear to absorb moisture and to keep your skin drier. SEEK MEDICAL CARE IF:   Your urine is cloudy or smells unusually bad.  Your catheter becomes clogged.  You are not draining urine into the bag or your bladder feels full.  Your catheter starts to leak. SEEK IMMEDIATE MEDICAL CARE IF:   You have pain, swelling, redness, or pus where the catheter enters the body.  You have pain in the abdomen, legs, lower back, or bladder.  You have a fever.  You see blood fill the catheter, or your urine is pink or red.  You have nausea, vomiting, or chills.  Your catheter gets pulled out. MAKE SURE YOU:   Understand these instructions.  Will watch your condition.  Will get help right away if you are not doing well or get worse.   This information is not intended to  replace advice given to you by your health care provider. Make sure you discuss any questions you have with your health care provider.   Document Released: 09/20/2005 Document Revised: 02/04/2014 Document Reviewed: 09/11/2012 Elsevier Interactive Patient Education [employer].

## 2015-09-25 NOTE — H&P (Signed)
Reason For Visit Review MRI & PUS   Active Problems Problems  1. Benign prostatic hypertrophy with weak urinary stream (N40.1,R39.12)   Assessed By: Carolan Clines (Urology); Last Assessed: 05 Sep 2015 2. Bladder Mass (___ Cm)   Assessed By: Carolan Clines (Urology); Last Assessed: 01 Sep 2015 3. Microscopic hematuria (R31.29) 4. Renal lesion (N28.9) 5. Stage III chronic kidney disease (N18.3)   Assessed By: Carolan Clines (Urology); Last Assessed: 15 Sep 2015  History of Present Illness    81 yo male returns today to review MRI & PUS. Originally referred by Dr. Vickki Muff for further evaluation of BPH, failing Tamsulosin. He has an 30 pk/yr hx of smoking. IPSS= 16/7. Urinary frequency, weak stream, and nocturia. 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 Cr. 1.31; gfr 49 (CKD=3A)     U/a showed significant microhematuria, and cytologies were abnormal. FISH showed TDD, and CT hematuria protocol shows bladder abnormalities. RLP abnormality is seen and MRI is recommended to R/o RCC. 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 He is scheduled for cysto/TURBT on 09/25/15.     09/05/15 Prostate u/s:  L: 4.39, H: 32.21, w: 3.89. volume 28.71cc.   Past Medical History Problems  1. History of arthritis (Z87.39) 2. History of asthma (Z87.09) 3. History of hypercholesterolemia (Z86.39) 4. History of hypertension (Z86.79) 5. History of sleep apnea (Z87.09)  Surgical History Problems  1. History of Adenoidectomy 2. History of CABG 3. History of Cholecystectomy 4. History of Hernia Repair 5. History of Tonsillectomy  Current Meds 1. Albuterol Sulfate (2.5 MG/3ML) 0.083% Inhalation Nebulization Solution;  Therapy: (Recorded:04Nov2016) to Recorded 2. Aspirin 81 MG TABS;  Therapy: (Recorded:04Nov2016) to Recorded 3. Atorvastatin Calcium 20 MG Oral Tablet;  Therapy: (Recorded:04Nov2016) to Recorded 4. Co Q 10 CAPS;  Therapy: (Recorded:04Nov2016) to Recorded 5.  Colace CAPS;  Therapy: (Recorded:04Nov2016) to Recorded 6. Finasteride 5 MG Oral Tablet;  Therapy: (Recorded:04Nov2016) to Recorded 7. Fish Oil CAPS;  Therapy: (Recorded:04Nov2016) to Recorded 8. Irbesartan-Hydrochlorothiazide 150-12.5 MG Oral Tablet;  Therapy: (Recorded:04Nov2016) to Recorded 9. Levothyroxine Sodium TABS;  Therapy: (Recorded:04Nov2016) to Recorded 10. Melatonin TABS;   Therapy: (Recorded:04Nov2016) to Recorded 11. Osteo-Bi-Flex TABS;   Therapy: (Recorded:04Nov2016) to Recorded 12. Symbicort 160-4.5 MCG/ACT Inhalation Aerosol;   Therapy: (Recorded:04Nov2016) to Recorded 13. Tamsulosin HCl - 0.4 MG Oral Capsule;   Therapy: (Recorded:04Nov2016) to Recorded  Allergies Medication  1. Codeine Derivatives  Family History Problems  1. Family history of Heart trouble : Father  Social History Problems  1. Alcohol use (Z78.9)   1-2 2. Denied: History of Caffeine use 3. Former smoker [phone])   1/2 ppd/ 25 year; quit 1976 4. Retired  Review of Systems Genitourinary, constitutional, skin, eye, otolaryngeal, hematologic/lymphatic, cardiovascular, pulmonary, endocrine, musculoskeletal, gastrointestinal, neurological and psychiatric system(s) were reviewed and pertinent findings if present are noted and are otherwise negative.  Genitourinary: urinary frequency, feelings of urinary urgency, nocturia, weak urinary stream, urinary stream starts and stops, incomplete emptying of bladder, erectile dysfunction and initiating urination requires straining.  Gastrointestinal: diarrhea.  Integumentary: pruritus.  Hematologic/Lymphatic: a tendency to easily bruise.  Cardiovascular: leg swelling.  Respiratory: shortness of breath.  Psychiatric: anxiety.    Vitals Vital Signs [Data Includes: Last 1 Day]  Recorded: 12Dec2016 04:22PM  Blood Pressure: 146 / 58 Temperature: 97.6 F Heart Rate: 48  Physical Exam Constitutional: Well nourished and well developed . No acute  distress.  ENT:. The ears and nose are normal in appearance.  Neck: The appearance of the neck is normal.  Pulmonary: No respiratory distress and normal respiratory rhythm and effort.  Cardiovascular: Heart rate and rhythm are normal . No peripheral edema.  Abdomen: The abdomen is mildly obese, but not distended. The abdomen is soft and nontender. No masses are palpated. No CVA tenderness. No hernias are palpable. No hepatosplenomegaly noted.  Rectal: Rectal exam demonstrates normal sphincter tone, the anus is normal on inspection., no tenderness and no masses. Estimated prostate size is 4+. The prostate has no nodularity and is not tender. The left seminal vesicle is nonpalpable. The right seminal vesicle is nonpalpable. The perineum is normal on inspection.  Genitourinary: Examination of the penis demonstrates no discharge, no masses, no lesions and a normal meatus. The penis is uncircumcised. The scrotum is without lesions. The right epididymis is palpably normal and non-tender. The left epididymis is palpably normal and non-tender. The right testis is palpably normal, non-tender and without masses. The left testis is normal, non-tender and without masses.  Lymphatics: The femoral and inguinal nodes are not enlarged or tender.  Skin: Normal skin turgor, no visible rash and no visible skin lesions.  Neuro/Psych:. Mood and affect are appropriate.    Assessment Assessed  1. Stage III chronic kidney disease (N18.3) 2. Bladder cancer (C67.9) 3. 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 Microscopic hematuria (R31.29) 5. Multiple renal cysts (Q61.02)  Family conference. ( 45 min)  Pt has solitary tumor on right side of the bladder, 2cm. He will have TUR-BT and Mitomycin-C His kidney is a benign cyst, B-II.   Plan Continue with plans for TUR-BT and bladder wash chemotherapy.   Signatures Electronically signed by : Carolan Clines, M.D.; Sep 15 2015  5:10PM EST

## 2015-09-25 NOTE — Op Note (Signed)
Pre-operative diagnosis :   Multiple bladder tumors  Postoperative diagnosis:  3 cm right bladder base bladder tumor and 2 cm right trigonal bladder tumor  Operation cystourethroscopy, transurethral resection of multiple bladder tumors   Surgeon:  S. Gaynelle Arabian, MD  First assistant:  None  Anesthesia:  General LMA  Preparation:  After appropriate preanesthesia, the patient was brought to the operating and placed on the operating table in the dorsal supine position where general LMA anesthesia was introduced. He was then replaced and aortic dorsal lithotomy position with the pubis was prepped with Betadine solution and draped in usual fashion. The arm was double checked. The history was double checked.  Review history:  Problems  1. Benign prostatic hypertrophy with weak urinary stream (N40.1,R39.12)  Assessed By: Carolan Clines (Urology); Last Assessed: 05 Sep 2015 2. Bladder Mass (___ Cm)  Assessed By: Carolan Clines (Urology); Last Assessed: 01 Sep 2015 3. Microscopic hematuria (R31.29) 4. Renal lesion (N28.9) 5. Stage III chronic kidney disease (N18.3)  Assessed By: Carolan Clines (Urology); Last Assessed: 15 Sep 2015  History of Present Illness   81 yo male returns today to review MRI & PUS. Originally referred by Dr. Vickki Muff for further evaluation of BPH, failing Tamsulosin. He has an 30 pk/yr hx of smoking. IPSS= 16/7. Urinary frequency, weak stream, and nocturia. 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 Cr. 1.31; gfr 49 (CKD=3A)    U/a showed significant microhematuria, and cytologies were abnormal. FISH showed TDD, and CT hematuria protocol shows bladder abnormalities. RLP abnormality is seen and MRI is recommended to R/o RCC. 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 He is scheduled for cysto/TURBT on 09/25/15.     09/05/15 Prostate u/s: L: 4.39, H: 32.21, w: 3.89. volume 28.71cc.   Past Medical History Problems  1. History of arthritis  (Z87.39) 2. History of asthma (Z87.09) 3. History of hypercholesterolemia (Z86.39) 4. History of hypertension (Z86.79) 5. History of sleep apnea (Z87.09)  Statement of  Likelihood of Success: Excellent. TIME-OUT observed.:  Procedure:  Cystourethroscopy was accomplished and showed bilobar BPH and normal-appearing urethra. Upon entering the bladder, I'm easily encountered to latter tumors on the right side of the bladder. There was a IIIc manner right lateral bladder base bladder tumor, and a 2 cm right trigonal bladder tumor. No other bladder tumors were identified within the bladder. Using a resectoscope, each tumor was individually resected, and sent individually to the laboratory with tissue timeout to identify the tumors. Photo documentation of the tumors was a Copper's.  Following this, inspection of bladder revealed no other evidence of bladder tumor. Clear reflux was seen from both orifices.  The bladder was drained of fluid, and inspection revealed that deep resection of the bladder base tumor was into the fat at the bladder base. Therefore, I elected to not place mitomycin within the bladder postoperative leak. An 7 French 10 mL Foley catheter was placed and left to straight drainage. The patient was awakened and taken to recovery room in good condition.

## 2015-09-25 NOTE — Anesthesia Postprocedure Evaluation (Signed)
Anesthesia Post Note  Patient: Jack Castillo  Procedure(s) Performed: Procedure(s) (LRB): TRANSURETHRAL RESECTION OF BLADDER TUMOR WITH GYRUS (TURBT-GYRUS) X 2, 3 CM RIGHT BLADDER BASE. RIGHT BLADDER TRIGONE 2 CM (N/A)  Patient location during evaluation: PACU Anesthesia Type: General Level of consciousness: awake and alert Pain management: pain level controlled Vital Signs Assessment: post-procedure vital signs reviewed and stable Respiratory status: spontaneous breathing, nonlabored ventilation, respiratory function stable and patient connected to nasal cannula oxygen Cardiovascular status: blood pressure returned to baseline and stable Postop Assessment: no signs of nausea or vomiting Anesthetic complications: no    Last Vitals:  Filed Vitals:   09/25/15 1345 09/25/15 1509  BP: 175/61 157/63  Pulse: 51 51  Temp:  36.7 C  Resp: 16 14    Last Pain: There were no vitals filed for this visit.               Jaser Fullen J

## 2015-09-26 ENCOUNTER — Encounter (HOSPITAL_BASED_OUTPATIENT_CLINIC_OR_DEPARTMENT_OTHER): Payer: Self-pay | Admitting: Urology

## 2015-10-20 ENCOUNTER — Other Ambulatory Visit: Payer: Self-pay | Admitting: Urology

## 2015-11-24 ENCOUNTER — Encounter (HOSPITAL_BASED_OUTPATIENT_CLINIC_OR_DEPARTMENT_OTHER): Payer: Self-pay | Admitting: *Deleted

## 2015-11-24 NOTE — Progress Notes (Signed)
NPO AFTER MN.  ARRIVE AT 0700.  NEES ISTAT.  CURRENT EKG (01-03-2015) AND  CARDIOLOGIST LOV NOTE IN EPIC under Care Everywhere Tab.  WILL TAKE SYNTHROID, AND DO SYMBICORT INHALER/ NEBULIZER AM DOS W/ SIPS OF WATER.

## 2015-11-26 NOTE — Anesthesia Preprocedure Evaluation (Signed)
Anesthesia Evaluation  Patient identified by MRN, date of birth, ID band Patient awake    Reviewed: Allergy & Precautions, H&P , NPO status , Patient's Chart, lab work & pertinent test results, reviewed documented beta blocker date and time   Airway Mallampati: II  TM Distance: >3 FB Neck ROM: Full    Dental no notable dental hx.    Pulmonary sleep apnea and Continuous Positive Airway Pressure Ventilation , COPD,  COPD inhaler, former smoker,    Pulmonary exam normal breath sounds clear to auscultation       Cardiovascular Exercise Tolerance: Good hypertension, Pt. on medications + CAD  Normal cardiovascular exam+ dysrhythmias  Rhythm:Regular Rate:Normal     Neuro/Psych negative neurological ROS  negative psych ROS   GI/Hepatic Neg liver ROS, hiatal hernia,   Endo/Other  Hypothyroidism   Renal/GU Renal disease  negative genitourinary   Musculoskeletal  (+) Arthritis ,   Abdominal   Peds negative pediatric ROS (+)  Hematology  (+) anemia ,   Anesthesia Other Findings S/P CABG x 7  early 1990's and re-do late 1990s Hypertension        COPD  controlled -- exercises on treadmil without sob  OSA on CPAP       Hypothyroidism     History of hiatal hernia    Arthritis    Incomplete right bundle branch block    First degree heart block     Lower urinary tract symptoms (LUTS)   Coronary artery disease  cardiologist- dr Ubaldo Glassing Jefm Bryant) LOV 07-09-2015 (note in epic under Care everywhere tab) s/p cabg x7 early 1990s and re-do cabg late 1990s    Reproductive/Obstetrics negative OB ROS                             Anesthesia Physical  Anesthesia Plan  ASA: II  Anesthesia Plan: General   Post-op Pain Management:    Induction: Intravenous  Airway Management Planned: LMA  Additional Equipment:   Intra-op Plan:   Post-operative Plan: Extubation in OR  Informed  Consent: I have reviewed the patients History and Physical, chart, labs and discussed the procedure including the risks, benefits and alternatives for the proposed anesthesia with the patient or authorized representative who has indicated his/her understanding and acceptance.   Dental Advisory Given and Dental advisory given  Plan Discussed with: CRNA and Surgeon  Anesthesia Plan Comments: (5 LMA prior; 120 mg Propofol Discussed GA with LMA, possible sore throat, potential need to switch to ETT, N/V, pulmonary aspiration. Questions answered. )        Anesthesia Quick Evaluation

## 2015-11-28 ENCOUNTER — Ambulatory Visit (HOSPITAL_BASED_OUTPATIENT_CLINIC_OR_DEPARTMENT_OTHER): Payer: Medicare Other | Admitting: Anesthesiology

## 2015-11-28 ENCOUNTER — Encounter (HOSPITAL_BASED_OUTPATIENT_CLINIC_OR_DEPARTMENT_OTHER): Payer: Self-pay | Admitting: Anesthesiology

## 2015-11-28 ENCOUNTER — Encounter (HOSPITAL_BASED_OUTPATIENT_CLINIC_OR_DEPARTMENT_OTHER)
Admission: RE | Disposition: A | Discharge status: Home / Self Care | Payer: Self-pay | Source: Ambulatory Visit | Attending: Urology

## 2015-11-28 ENCOUNTER — Ambulatory Visit (HOSPITAL_BASED_OUTPATIENT_CLINIC_OR_DEPARTMENT_OTHER)
Admission: RE | Admit: 2015-11-28 | Discharge: 2015-11-28 | Disposition: A | Discharge status: Home / Self Care | Payer: Medicare Other | Source: Ambulatory Visit | Attending: Urology | Admitting: Urology

## 2015-11-28 DIAGNOSIS — C674 Malignant neoplasm of posterior wall of bladder: Secondary | ICD-10-CM | POA: Insufficient documentation

## 2015-11-28 DIAGNOSIS — R3915 Urgency of urination: Secondary | ICD-10-CM | POA: Insufficient documentation

## 2015-11-28 DIAGNOSIS — I700 Atherosclerosis of aorta: Secondary | ICD-10-CM | POA: Insufficient documentation

## 2015-11-28 DIAGNOSIS — R3912 Poor urinary stream: Secondary | ICD-10-CM | POA: Diagnosis not present

## 2015-11-28 DIAGNOSIS — Z9989 Dependence on other enabling machines and devices: Secondary | ICD-10-CM | POA: Diagnosis not present

## 2015-11-28 DIAGNOSIS — Z951 Presence of aortocoronary bypass graft: Secondary | ICD-10-CM | POA: Diagnosis not present

## 2015-11-28 DIAGNOSIS — R3914 Feeling of incomplete bladder emptying: Secondary | ICD-10-CM | POA: Insufficient documentation

## 2015-11-28 DIAGNOSIS — N401 Enlarged prostate with lower urinary tract symptoms: Secondary | ICD-10-CM | POA: Diagnosis not present

## 2015-11-28 DIAGNOSIS — Z87891 Personal history of nicotine dependence: Secondary | ICD-10-CM | POA: Insufficient documentation

## 2015-11-28 DIAGNOSIS — N183 Chronic kidney disease, stage 3 (moderate): Secondary | ICD-10-CM | POA: Diagnosis not present

## 2015-11-28 DIAGNOSIS — Z79899 Other long term (current) drug therapy: Secondary | ICD-10-CM | POA: Insufficient documentation

## 2015-11-28 DIAGNOSIS — I2510 Atherosclerotic heart disease of native coronary artery without angina pectoris: Secondary | ICD-10-CM | POA: Diagnosis not present

## 2015-11-28 DIAGNOSIS — C670 Malignant neoplasm of trigone of bladder: Secondary | ICD-10-CM | POA: Diagnosis present

## 2015-11-28 DIAGNOSIS — Z8551 Personal history of malignant neoplasm of bladder: Secondary | ICD-10-CM | POA: Diagnosis not present

## 2015-11-28 DIAGNOSIS — G4730 Sleep apnea, unspecified: Secondary | ICD-10-CM | POA: Insufficient documentation

## 2015-11-28 DIAGNOSIS — M1990 Unspecified osteoarthritis, unspecified site: Secondary | ICD-10-CM | POA: Diagnosis not present

## 2015-11-28 DIAGNOSIS — I4510 Unspecified right bundle-branch block: Secondary | ICD-10-CM | POA: Insufficient documentation

## 2015-11-28 DIAGNOSIS — R350 Frequency of micturition: Secondary | ICD-10-CM | POA: Insufficient documentation

## 2015-11-28 DIAGNOSIS — J449 Chronic obstructive pulmonary disease, unspecified: Secondary | ICD-10-CM | POA: Insufficient documentation

## 2015-11-28 DIAGNOSIS — E039 Hypothyroidism, unspecified: Secondary | ICD-10-CM | POA: Diagnosis not present

## 2015-11-28 DIAGNOSIS — R3916 Straining to void: Secondary | ICD-10-CM | POA: Diagnosis not present

## 2015-11-28 DIAGNOSIS — I517 Cardiomegaly: Secondary | ICD-10-CM | POA: Diagnosis not present

## 2015-11-28 DIAGNOSIS — Z7951 Long term (current) use of inhaled steroids: Secondary | ICD-10-CM | POA: Diagnosis not present

## 2015-11-28 DIAGNOSIS — E7800 Pure hypercholesterolemia, unspecified: Secondary | ICD-10-CM | POA: Insufficient documentation

## 2015-11-28 DIAGNOSIS — I129 Hypertensive chronic kidney disease with stage 1 through stage 4 chronic kidney disease, or unspecified chronic kidney disease: Secondary | ICD-10-CM | POA: Insufficient documentation

## 2015-11-28 DIAGNOSIS — Z7982 Long term (current) use of aspirin: Secondary | ICD-10-CM | POA: Diagnosis not present

## 2015-11-28 DIAGNOSIS — N281 Cyst of kidney, acquired: Secondary | ICD-10-CM | POA: Insufficient documentation

## 2015-11-28 DIAGNOSIS — R351 Nocturia: Secondary | ICD-10-CM | POA: Diagnosis not present

## 2015-11-28 DIAGNOSIS — D649 Anemia, unspecified: Secondary | ICD-10-CM | POA: Diagnosis not present

## 2015-11-28 DIAGNOSIS — J45909 Unspecified asthma, uncomplicated: Secondary | ICD-10-CM | POA: Insufficient documentation

## 2015-11-28 HISTORY — PX: CYSTOSCOPY WITH BIOPSY: SHX5122

## 2015-11-28 HISTORY — DX: Malignant neoplasm of bladder, unspecified: C67.9

## 2015-11-28 LAB — POCT I-STAT 4, (NA,K, GLUC, HGB,HCT)
Glucose, Bld: 95 mg/dL (ref 65–99)
HEMATOCRIT: 33 % — AB (ref 39.0–52.0)
Hemoglobin: 11.2 g/dL — ABNORMAL LOW (ref 13.0–17.0)
Potassium: 4.2 mmol/L (ref 3.5–5.1)
SODIUM: 141 mmol/L (ref 135–145)

## 2015-11-28 SURGERY — CYSTOSCOPY, WITH BIOPSY
Anesthesia: General

## 2015-11-28 MED ORDER — ARTIFICIAL TEARS OP OINT
TOPICAL_OINTMENT | OPHTHALMIC | Status: AC
  Filled 2015-11-28: qty 3.5

## 2015-11-28 MED ORDER — ONDANSETRON HCL 4 MG/2ML IJ SOLN
4.0000 mg | Freq: Once | INTRAMUSCULAR | Status: AC
  Administered 2015-11-28: 4 mg via INTRAVENOUS
  Filled 2015-11-28: qty 2

## 2015-11-28 MED ORDER — CEFAZOLIN SODIUM-DEXTROSE 2-3 GM-% IV SOLR
INTRAVENOUS | Status: AC
  Filled 2015-11-28: qty 50

## 2015-11-28 MED ORDER — LIDOCAINE HCL (CARDIAC) 20 MG/ML IV SOLN
INTRAVENOUS | Status: DC | PRN
  Administered 2015-11-28: 60 mg via INTRAVENOUS

## 2015-11-28 MED ORDER — BELLADONNA ALKALOIDS-OPIUM 16.2-60 MG RE SUPP
RECTAL | Status: AC
  Filled 2015-11-28: qty 1

## 2015-11-28 MED ORDER — LACTATED RINGERS IV SOLN
INTRAVENOUS | Status: DC
  Administered 2015-11-28: 08:00:00 via INTRAVENOUS
  Filled 2015-11-28: qty 1000

## 2015-11-28 MED ORDER — KETOROLAC TROMETHAMINE 30 MG/ML IJ SOLN
INTRAMUSCULAR | Status: AC
  Filled 2015-11-28: qty 1

## 2015-11-28 MED ORDER — EPHEDRINE SULFATE 50 MG/ML IJ SOLN
INTRAMUSCULAR | Status: AC
  Filled 2015-11-28: qty 1

## 2015-11-28 MED ORDER — ACETAMINOPHEN 500 MG PO TABS
1000.0000 mg | ORAL_TABLET | Freq: Four times a day (QID) | ORAL | Status: DC | PRN
  Administered 2015-11-28: 1000 mg via ORAL
  Filled 2015-11-28: qty 2

## 2015-11-28 MED ORDER — EPHEDRINE SULFATE 50 MG/ML IJ SOLN
INTRAMUSCULAR | Status: DC | PRN
  Administered 2015-11-28: 10 mg via INTRAVENOUS

## 2015-11-28 MED ORDER — ACETAMINOPHEN 160 MG/5ML PO SOLN
975.0000 mg | Freq: Once | ORAL | Status: DC
  Filled 2015-11-28: qty 30.5

## 2015-11-28 MED ORDER — FENTANYL CITRATE (PF) 100 MCG/2ML IJ SOLN
INTRAMUSCULAR | Status: AC
  Filled 2015-11-28: qty 2

## 2015-11-28 MED ORDER — CEFAZOLIN SODIUM-DEXTROSE 2-3 GM-% IV SOLR
2.0000 g | INTRAVENOUS | Status: AC
  Administered 2015-11-28: 2 g via INTRAVENOUS
  Filled 2015-11-28: qty 50

## 2015-11-28 MED ORDER — DEXAMETHASONE SODIUM PHOSPHATE 4 MG/ML IJ SOLN
INTRAMUSCULAR | Status: DC | PRN
  Administered 2015-11-28 (×2): 4 mg via INTRAVENOUS

## 2015-11-28 MED ORDER — PROPOFOL 10 MG/ML IV BOLUS
INTRAVENOUS | Status: AC
  Filled 2015-11-28: qty 20

## 2015-11-28 MED ORDER — PROPOFOL 10 MG/ML IV BOLUS
INTRAVENOUS | Status: DC | PRN
  Administered 2015-11-28: 20 mg via INTRAVENOUS
  Administered 2015-11-28: 100 mg via INTRAVENOUS

## 2015-11-28 MED ORDER — ONDANSETRON HCL 4 MG/2ML IJ SOLN
INTRAMUSCULAR | Status: AC
Start: 2015-11-28 — End: 2015-11-28
  Filled 2015-11-28: qty 2

## 2015-11-28 MED ORDER — DEXAMETHASONE SODIUM PHOSPHATE 10 MG/ML IJ SOLN
INTRAMUSCULAR | Status: AC
  Filled 2015-11-28: qty 1

## 2015-11-28 MED ORDER — FENTANYL CITRATE (PF) 100 MCG/2ML IJ SOLN
25.0000 ug | INTRAMUSCULAR | Status: DC | PRN
  Filled 2015-11-28: qty 1

## 2015-11-28 MED ORDER — FENTANYL CITRATE (PF) 100 MCG/2ML IJ SOLN
INTRAMUSCULAR | Status: DC | PRN
  Administered 2015-11-28: 25 ug via INTRAVENOUS
  Administered 2015-11-28: 50 ug via INTRAVENOUS

## 2015-11-28 MED ORDER — LIDOCAINE HCL (CARDIAC) 20 MG/ML IV SOLN
INTRAVENOUS | Status: AC
  Filled 2015-11-28: qty 5

## 2015-11-28 MED ORDER — ONDANSETRON HCL 4 MG/2ML IJ SOLN
INTRAMUSCULAR | Status: DC | PRN
  Administered 2015-11-28: 4 mg via INTRAVENOUS

## 2015-11-28 MED ORDER — KETOROLAC TROMETHAMINE 30 MG/ML IJ SOLN
INTRAMUSCULAR | Status: DC | PRN
  Administered 2015-11-28: 15 mg via INTRAVENOUS

## 2015-11-28 MED ORDER — MELOXICAM 15 MG PO TABS: 15.0000 mg | ORAL_TABLET | Freq: Every day | ORAL | Status: AC

## 2015-11-28 MED ORDER — ACETAMINOPHEN 500 MG PO TABS
ORAL_TABLET | ORAL | Status: AC
  Filled 2015-11-28: qty 2

## 2015-11-28 MED ORDER — ONDANSETRON HCL 4 MG/2ML IJ SOLN
INTRAMUSCULAR | Status: AC
  Filled 2015-11-28: qty 2

## 2015-11-28 MED ORDER — PHENAZOPYRIDINE HCL 200 MG PO TABS: 200.0000 mg | ORAL_TABLET | Freq: Three times a day (TID) | ORAL | Status: AC | PRN

## 2015-11-28 SURGICAL SUPPLY — 33 items
BAG DRAIN URO-CYSTO SKYTR STRL (DRAIN) ×2 IMPLANT
BAG URINE DRAINAGE (UROLOGICAL SUPPLIES) IMPLANT
BAG URINE LEG 19OZ MD ST LTX (BAG) IMPLANT
BOOTIES KNEE HIGH SLOAN (MISCELLANEOUS) ×2 IMPLANT
CANISTER SUCT LVC 12 LTR MEDI- (MISCELLANEOUS) IMPLANT
CATH FOLEY 2WAY SLVR  5CC 20FR (CATHETERS)
CATH FOLEY 2WAY SLVR  5CC 22FR (CATHETERS)
CATH FOLEY 2WAY SLVR 5CC 20FR (CATHETERS) IMPLANT
CATH FOLEY 2WAY SLVR 5CC 22FR (CATHETERS) IMPLANT
CLOTH BEACON ORANGE TIMEOUT ST (SAFETY) ×2 IMPLANT
ELECT REM PT RETURN 9FT ADLT (ELECTROSURGICAL) ×2
ELECTRODE REM PT RTRN 9FT ADLT (ELECTROSURGICAL) ×1 IMPLANT
GLOVE BIO SURGEON STRL SZ7.5 (GLOVE) ×2 IMPLANT
GOWN STRL REUS W/ TWL LRG LVL3 (GOWN DISPOSABLE) ×1 IMPLANT
GOWN STRL REUS W/ TWL XL LVL3 (GOWN DISPOSABLE) ×1 IMPLANT
GOWN STRL REUS W/TWL LRG LVL3 (GOWN DISPOSABLE) ×1
GOWN STRL REUS W/TWL XL LVL3 (GOWN DISPOSABLE) ×1
GOWN XL W/COTTON TOWEL STD (GOWNS) ×2 IMPLANT
HOLDER FOLEY CATH W/STRAP (MISCELLANEOUS) IMPLANT
KIT ROOM TURNOVER WOR (KITS) ×2 IMPLANT
MANIFOLD NEPTUNE II (INSTRUMENTS) IMPLANT
NDL SAFETY ECLIPSE 18X1.5 (NEEDLE) IMPLANT
NEEDLE HYPO 18GX1.5 SHARP (NEEDLE)
NEEDLE HYPO 22GX1.5 SAFETY (NEEDLE) IMPLANT
NEEDLE SPNL 22GX7 QUINCKE BK (NEEDLE) IMPLANT
NS IRRIG 500ML POUR BTL (IV SOLUTION) IMPLANT
PACK CYSTO (CUSTOM PROCEDURE TRAY) ×2 IMPLANT
PLUG CATH AND CAP STER (CATHETERS) IMPLANT
SET ASPIRATION TUBING (TUBING) IMPLANT
SYR 20CC LL (SYRINGE) IMPLANT
SYRINGE IRR TOOMEY STRL 70CC (SYRINGE) IMPLANT
TUBE CONNECTING 12X1/4 (SUCTIONS) IMPLANT
WATER STERILE IRR 3000ML UROMA (IV SOLUTION) ×2 IMPLANT

## 2015-11-28 NOTE — Interval H&P Note (Signed)
History and Physical Interval Note:  11/28/2015 8:47 AM  Jack Castillo  has presented today for surgery, with the diagnosis of BLADDER CANCER  The various methods of treatment have been discussed with the patient and family. After consideration of risks, benefits and other options for treatment, the patient has consented to  Procedure(s): BLADDER WITH BIOPSY (N/A) POSSIBLE TRANSURETHRAL RESECTION OF BLADDER TUMOR (TURBT) (N/A) as a surgical intervention .  The patient's history has been reviewed, patient examined, no change in status, stable for surgery.  I have reviewed the patient's chart and labs.  Questions were answered to the patient's satisfaction.     Shala Baumbach I Vernell Townley

## 2015-11-28 NOTE — Anesthesia Procedure Notes (Signed)
Procedure Name: LMA Insertion Date/Time: 11/28/2015 8:49 AM Performed by: Mechele Claude Pre-anesthesia Checklist: Patient identified, Emergency Drugs available, Suction available and Patient being monitored Patient Re-evaluated:Patient Re-evaluated prior to inductionOxygen Delivery Method: Circle System Utilized Preoxygenation: Pre-oxygenation with 100% oxygen Intubation Type: IV induction Ventilation: Mask ventilation without difficulty LMA: LMA inserted LMA Size: 5.0 Number of attempts: 1 Airway Equipment and Method: bite block Placement Confirmation: positive ETCO2 Tube secured with: Tape Dental Injury: Teeth and Oropharynx as per pre-operative assessment

## 2015-11-28 NOTE — Anesthesia Postprocedure Evaluation (Signed)
Anesthesia Post Note  Patient: Jack Castillo  Procedure(s) Performed: Procedure(s) (LRB): BLADDER WITH BIOPSY (N/A)  Patient location during evaluation: PACU Anesthesia Type: General Level of consciousness: sedated Pain management: satisfactory to patient Vital Signs Assessment: post-procedure vital signs reviewed and stable Respiratory status: spontaneous breathing Cardiovascular status: stable Anesthetic complications: no    Last Vitals:  Filed Vitals:   11/28/15 1015 11/28/15 1055  BP:  136/57  Pulse: 48 51  Temp:  36.5 C  Resp: 17 16    Last Pain: There were no vitals filed for this visit.               Riccardo Dubin

## 2015-11-28 NOTE — Op Note (Signed)
Pre-operative diagnosis :   High-grade noninvasive TCC of the bladder  Postoperative diagnosis:  Same  Operation: Cystourethroscopy, cold cup bladder biopsies   Surgeon:  S. Gaynelle Arabian, MD  First assistant: None    Anesthesia:  General LMA   Preparation:  After appropriate preanesthesia, the patient was brought the [place], placed on the operating table in the dorsal supine position where general LMA anesthesia was introduced. Arm and was double checked. The history was double checked.   Review history:  Problems  1. 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 Originally referred by Dr. Vickki Muff for further evaluation of BPH, failing Tamsulosin. He has an 30 pk/yr hx of smoking. IPSS= 16/7. Urinary frequency, weak stream, and nocturia. 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 Cr. 1.31; gfr 49 (CKD=3A)    U/a showed significant microhematuria, and cytologies were abnormal. FISH showed TDD, and CT hematuria protocol shows bladder abnormalities. RLP abnormality is seen and MRI is recommended to R/o RCC. 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 He is scheduled for cysto/TURBT on 09/25/15.    Pathology showed high grade, but non-invasive tumor, and pt is now for 2nd look, and repeat bladder biopsies.     Statement of  Likelihood of Success: Excellent. TIME-OUT observed.:  Procedure:  Cystourethroscopy was [facility], showing bilobar BPH. The bladder neck was in normal position. The trigone was normal and clear reflux was seen from both ureteral orifices. There was no tobacco, and no cellule formation. There was no bladder stones or diverticular formation. Repeat inspection of the bladder showed no evidence of bladder cancer. Cold cup bladder  biopsies were taken from the bladder base, in the area of previous bladder tumor resection. The areas of biopsies were then cauterized, and following this, no bleeding was noted. I elected to not leave a Foley catheter. The patient was given IV Toradol, and awakened, and taken to recovery room in good condition. 2 bladder biopsies were sent separately to the laboratory, both taken from the bladder base.

## 2015-11-28 NOTE — H&P (Signed)
Reason For Visit Post op f/u   Active Problems Problems  1. 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 Originally referred by Dr. Vickki Muff for further evaluation of BPH, failing Tamsulosin. He has an 30 pk/yr hx of smoking. IPSS= 16/7. Urinary frequency, weak stream, and nocturia. 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 Cr. 1.31; gfr 49 (CKD=3A)     U/a showed significant microhematuria, and cytologies were abnormal. FISH showed TDD, and CT hematuria protocol shows bladder abnormalities. RLP abnormality is seen and MRI is recommended to R/o RCC. 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 He is scheduled for cysto/TURBT on 09/25/15.     09/05/15 Prostate u/s:  L: 4.39, H: 32.21, w: 3.89. volume 28.71cc.   Past Medical History Problems  1. History of arthritis (Z87.39) 2. History of asthma (Z87.09) 3. History of hypercholesterolemia (Z86.39) 4. History of hypertension (Z86.79) 5. History of sleep apnea (Z86.69)  Surgical History Problems  1. History of Adenoidectomy 2. History of CABG 3. History of Cholecystectomy 4. History of Hernia Repair 5. History of Tonsillectomy  Current Meds 1. Albuterol Sulfate (2.5 MG/3ML) 0.083% Inhalation Nebulization Solution;  Therapy: (Recorded:04Nov2016) to Recorded 2. Aspirin 81 MG TABS;  Therapy: (Recorded:04Nov2016) to Recorded 3. Atorvastatin Calcium 20 MG Oral Tablet;  Therapy: (Recorded:04Nov2016) to Recorded 4. Avalide 300-12.5 MG Oral Tablet;  Therapy: (Recorded:11Jan2017) to Recorded 5. Co Q 10 CAPS;  Therapy: (Recorded:04Nov2016) to Recorded 6. Colace CAPS;  Therapy: (Recorded:04Nov2016) to Recorded 7. Finasteride 5 MG Oral Tablet;  Therapy: (Recorded:04Nov2016) to Recorded 8. Fish  Oil CAPS;  Therapy: (Recorded:04Nov2016) to Recorded 9. Levothyroxine Sodium TABS;  Therapy: (Recorded:04Nov2016) to Recorded 10. Melatonin TABS;   Therapy: (Recorded:04Nov2016) to Recorded 11. Osteo-Bi-Flex TABS;   Therapy: (Recorded:04Nov2016) to Recorded 12. Symbicort 160-4.5 MCG/ACT Inhalation Aerosol;   Therapy: (Recorded:04Nov2016) to Recorded 13. Tamsulosin HCl - 0.4 MG Oral Capsule;   Therapy: (Recorded:04Nov2016) to Recorded  Allergies Medication  1. Codeine Derivatives  Family History Problems  1. Family history of Heart trouble : Father  Social History Problems  1. Alcohol use (Z78.9)   1-2 2. Denied: History of Caffeine use 3. Former smoker [id number]   1/2 ppd/ 25 year; quit 1976 4. Retired  Review of Systems Genitourinary, constitutional, skin, eye, otolaryngeal, hematologic/lymphatic, cardiovascular, pulmonary, endocrine, musculoskeletal, gastrointestinal, neurological and psychiatric system(s) were reviewed and pertinent findings if present are noted and are otherwise negative.  Genitourinary: urinary frequency, feelings of urinary urgency, nocturia, weak urinary stream, urinary stream starts and stops, incomplete emptying of bladder, erectile dysfunction and initiating urination requires straining.  Gastrointestinal: diarrhea.  Integumentary: pruritus.  Hematologic/Lymphatic: a tendency to easily bruise.  Cardiovascular: leg swelling.  Respiratory: shortness of breath.  Psychiatric: anxiety.    Vitals Vital Signs [Data Includes: Last 1 Day]  Recorded: [id number] 09:50AM  Blood Pressure: 160 / 66 Temperature: 97.2 F Heart Rate: 50  Physical Exam Constitutional: Well nourished and well developed . No acute distress. The patient appears well hydrated.  ENT:. The ears and nose are normal in appearance.  Neck: The appearance of the neck is normal.  Cardiovascular: Heart rate and rhythm are normal.  Abdomen: The abdomen is mildly obese, but not  distended. No masses are palpated. The abdomen is normal to  percussion.  Genitourinary: Examination of the penis demonstrates no discharge, no masses, no lesions and a normal meatus. The penis is uncircumcised. The scrotum is without lesions. The right epididymis is palpably normal and non-tender. The left epididymis is palpably normal and non-tender. The right testis is palpably normal, non-tender and without masses. The left testis is normal, non-tender and without masses.  Lymphatics: The femoral and inguinal nodes are not enlarged or tender.  Skin: Normal skin turgor, no visible rash and no visible skin lesions.  Neuro/Psych:. Mood and affect are appropriate.    Results/Data Selected Results  UA With REFLEX [id number] 09:29AM Gaynelle Arabian, Katerra Ingman  SPECIMEN TYPE: [place]   Test Name Result Flag Reference  COLOR YELLOW  YELLOW  ** PLEASE NOTE CHANGE IN UNIT OF MEASURE AND REFERENCE RANGE(S). **  APPEARANCE CLEAR  CLEAR  SPECIFIC GRAVITY 1.025  1.001-1.035  pH 5.5  5.0-8.0  GLUCOSE NEGATIVE  NEGATIVE  BILIRUBIN NEGATIVE  NEGATIVE  KETONE NEGATIVE  NEGATIVE  BLOOD 2+ A NEGATIVE  PROTEIN 2+ A NEGATIVE  NITRITE NEGATIVE  NEGATIVE  LEUKOCYTE ESTERASE NEGATIVE  NEGATIVE  SQUAMOUS EPITHELIAL/HPF 0-5 HPF  <=5  WBC 0-5 WBC/HPF  <=5  RBC 3-10 RBC/HPF A <=2  BACTERIA NONE SEEN HPF  NONE SEEN  CRYSTALS NONE SEEN HPF  NONE SEEN  CASTS NONE SEEN LPF  NONE SEEN  Yeast NONE SEEN HPF  NONE SEEN   AU CT-HEMATURIA PROTOCOL [id number] 12:00AM Carolan Clines   Test Name Result Flag Reference  AU CT-HEMATURIA PROTOCOL (Report)    ** RADIOLOGY REPORT BY  RADIOLOGY, [facility] **   CLINICAL DATA: Microscopic hematuria from 3 weeks ago. Remote history of smoking. History of benign prostatic hyperplasia. No history renal stones.  EXAM: CT ABDOMEN AND PELVIS WITHOUT AND WITH CONTRAST  TECHNIQUE: Multidetector CT imaging of the abdomen and pelvis was performed following the standard  protocol before and following the bolus administration of intravenous contrast.  CONTRAST: 100 cc of Isovue-300  COMPARISON: None  FINDINGS: Lower chest: Minimal anterior right lung base nodularity, on the order of 2 mm on image 3 of series 6. A 2 mm left lower lobe pulmonary nodule on image 10 of series 6. Mild cardiomegaly with prior median sternotomy. No pericardial or pleural effusion.  Hepatobiliary: Too small to characterize lesions in the liver, likely tiny cysts. Cholecystectomy, without biliary ductal dilatation.  Pancreas: Fatty replacement throughout the pancreatic body and head, without duct dilatation or mass.  Spleen: Atypical splenic morphology with capsular bulge medially including on image 22 of series 4. Favored to be within normal variation ; Iso attenuating to the remainder the spleen throughout.  Adrenals/Urinary Tract: Mild left adrenal thickening. Right adrenal calcification likely relates to remote infection or hemorrhage.  No renal calculi or hydronephrosis. No hydroureter or ureteric calculi. No bladder calculi.  Bilateral renal cysts and too small to characterize lesions.  A lower pole right renal lesion measures 1.0 cm. Hyper attenuating prior to contrast, including on image 43 of series 2. Apparent increased density after contrast (86 Hounsfield units after contrast versus 66 Hounsfield units prior to contrast).  Moderate right and poor left-sided renal collecting system opacification on delayed images, despite 2 attempts. Moderate ureteric opacification, without filling defect.  A right-sided superior posterior bladder nodule measures 12 mm on image 75 of series 4. Hyper attenuating prior to contrast on image 75 of series 2, but favored to be enhancing, including on delayed image 59 of series 10.  Stomach/Bowel: Proximal gastric underdistention. Scattered  colonic diverticula. Normal terminal ileum. Normal small bowel.  Vascular/Lymphatic:  Aortic and branch vessel atherosclerosis. Multiple right renal arteries. No abdominopelvic adenopathy.  Reproductive: Mild prostatomegaly, with mild impression into the urinary bladder.  Other: No significant free fluid.  Musculoskeletal: No acute osseous abnormality.  IMPRESSION: 1. Posterior superior right bladder lesion, highly suspicious for transitional cell carcinoma. 2. Bilateral renal cysts. A lower pole right renal lesion is indeterminate and could represent a complex cyst or papillary type renal cell carcinoma. Complex cyst is slightly favored, given the extent of hyperattenuation prior to contrast. Consider dedicated pre and post contrast abdominal MRI for definitive characterization. 3. Tiny bibasilar pulmonary nodules, favored to be benign but technically indeterminate. Otherwise, no evidence of metastatic disease. These results will be called to the ordering clinician or representative by the Radiologist Assistant, and communication documented in the PACS or zVision Dashboard.   Electronically Signed  By: Abigail Miyamoto M.D.  On: 08/27/2015 13:06   Assessment Assessed  1. Stage III chronic kidney disease (N18.3) 2. 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 Bladder cancer (C67.9)  Pathology reviewed, with diagrams. He has high grade, but non-invasive TCC bladder. No muscle in bx, however. He will need 2nd look bladder bx and possible TUR-BT. He has 75% chance of bladder cancer recurrence. He may eventually need cystectomy and ileal loop. Note he is 82 yrs old. We have reviewed his MRI, showing B-II renal cyst. No evidence of upper tract TCC.   Plan Pt will have 2nd look bladder biopsies ( prefers Friday). in [place]. .   Discussion/Summary cc: Dr. Vickki Muff ( [place]).     Signatures Electronically signed by : Carolan Clines, M.D.; Oct 15 2015 10:59AM EST

## 2015-11-28 NOTE — Transfer of Care (Signed)
  Last Vitals:  Filed Vitals:   11/28/15 0659  BP: 143/64  Pulse: 53  Temp: 36.4 C  Resp: 16  Immediate Anesthesia Transfer of Care Note  Patient: Jack Castillo  Procedure(s) Performed: Procedure(s) (LRB): BLADDER WITH BIOPSY (N/A) POSSIBLE TRANSURETHRAL RESECTION OF BLADDER TUMOR (TURBT) (N/A)  Patient Location: PACU  Anesthesia Type: General  Level of Consciousness: awake, alert  and oriented  Airway & Oxygen Therapy: Patient Spontanous Breathing and Patient connected to face mask oxygen  Post-op Assessment: Report given to PACU RN and Post -op Vital signs reviewed and stable  Post vital signs: Reviewed and stable  Complications: No apparent anesthesia complications

## 2015-11-28 NOTE — Discharge Instructions (Addendum)
Bladder Cancer Bladder cancer is an abnormal growth of tissue in your bladder. Your bladder is the balloon-like sac in your pelvis. It collects and stores urine that comes from the kidneys through the ureters. The bladder wall is made of layers. If cancer spreads into these layers and through the wall of the bladder, it becomes more difficult to treat.  There are four stages of bladder cancer:  Stage I. Cancer at this stage occurs in the bladder's inner lining but has not invaded the muscular bladder wall.  Stage II. At this stage, cancer has invaded the bladder wall but is still confined to the bladder.  Stage III. By this stage, the cancer cells have spread through the bladder wall to surrounding tissue. They may also have spread to the prostate in men or the uterus or vagina in women.  Stage IV. By this stage, cancer cells may have spread to the lymph nodes and other organs, such as your lungs, bones, or liver. RISK FACTORS Although the cause of bladder cancer is not known, the following risk factors can increase your chances of getting bladder cancer:   Smoking.   Occupational exposures, such as rubber, leather, textile, dyes, chemicals, and paint.  Being white.  Age.   Being male.   Having chronic bladder inflammation.   Having a bladder cancer history.   Having a family history of bladder cancer (heredity).   Having had chemotherapy or radiation therapy to the pelvis.   Being exposed to arsenic.  SYMPTOMS   Blood in the urine.   Pain with urination.   Frequent bladder or urine infections.  Increase in urgency and frequency of urination. DIAGNOSIS  Your health care provider may suspect bladder cancer based on your description of urinary symptoms or based on the finding of blood or infection in the urine (especially if this has recurred several times). Other tests or procedures that may be performed include:   A narrow tube being inserted into your bladder  through your urethra (cystoscopy) in order to view the lining of your bladder for tumors.   A biopsy to sample the tumor to see if cancer is present.  If cancer is present, it will then be staged to determine its severity and extent. It is important to know how deeply into the bladder wall the cancer has grown and whether the cancer has spread to any other parts of your body. Staging may require blood tests or special scans such as a CT scan, MRI, bone scan, or chest X-ray.  TREATMENT  Once your cancer has been diagnosed and staged, you should discuss a treatment plan with your health care provider. Based on the stage of the cancer, one treatment or a combination of treatments may be recommended. The most common forms of treatment are:   Surgery. Procedures that may be done include transurethral resection and cystectomy.  Radiation therapy. This is infrequently used to treat bladder cancer.   Chemotherapy. During this treatment, drugs are used to kill cancer cells.  Immunotherapy. This is usually administered directly into the bladder. HOME CARE INSTRUCTIONS  Take medicines only as directed by your health care provider.   Maintain a healthy diet.   Consider joining a support group. This may help you learn to cope with the stress of having bladder cancer.   Seek advice to help you manage treatment side effects.   Keep all follow-up visits as directed by your health care provider.   Inform your cancer specialist if you are  admitted to the hospital.  [place] IF:  There is blood in your urine.  You have symptoms of a urinary tract infection. These include:  Tiredness.  Shakiness.  Weakness.  Muscle aches.  Abdominal pain.  Frequent and intense urge to urinate (in young women).  Burning feeling in the bladder or urethra during urination (in young women). SEEK IMMEDIATE MEDICAL CARE IF:  You are unable to urinate.   This information is not intended to  replace advice given to you by your health care provider. Make sure you discuss any questions you have with your health care provider.     HOME CARE INSTRUCTIONS  Activity: Rest for the remainder of the day.  Do not drive or operate equipment today.  You may resume normal activities in one to two days as instructed by your physician.   Meals: Drink plenty of liquids and eat light foods such as gelatin or soup this evening.  You may return to a normal meal plan tomorrow.  Return to Work: You may return to work in one to two days or as instructed by your physician.  Special Instructions / Symptoms: Call your physician if any of these symptoms occur:   -persistent or heavy bleeding  -bleeding which continues after first few urination  -large blood clots that are difficult to pass  -urine stream diminishes or stops completely  -fever equal to or higher than 101 degrees Farenheit.  -cloudy urine with a strong, foul odor  -severe pain  You may feel some burning pain when you urinate.  This should disappear with time.  Applying moist heat to the lower abdomen or a hot tub bath may help relieve the pain.   Follow-Up / Date of Return Visit to Your Physician:   Call for an appointment to arrange follow-up.    Post Anesthesia Home Care Instructions  Activity: Get plenty of rest for the remainder of the day. A responsible adult should stay with you for 24 hours following the procedure.  For the next 24 hours, DO NOT: -Drive a car -Paediatric nurse -Drink alcoholic beverages -Take any medication unless instructed by your physician -Make any legal decisions or sign important papers.  Meals: Start with liquid foods such as gelatin or soup. Progress to regular foods as tolerated. Avoid greasy, spicy, heavy foods. If nausea and/or vomiting occur, drink only clear liquids until the nausea and/or vomiting subsides. Call your physician if vomiting continues.  Special  Instructions/Symptoms: Your throat may feel dry or sore from the anesthesia or the breathing tube placed in your throat during surgery. If this causes discomfort, gargle with warm salt water. The discomfort should disappear within 24 hours.  If you had a scopolamine patch placed behind your ear for the management of post- operative nausea and/or vomiting:  1. The medication in the patch is effective for 72 hours, after which it should be removed.  Wrap patch in a tissue and discard in the trash. Wash hands thoroughly with soap and water. 2. You may remove the patch earlier than 72 hours if you experience unpleasant side effects which may include dry mouth, dizziness or visual disturbances. 3. Avoid touching the patch. Wash your hands with soap and water after contact with the patch.

## 2015-12-01 ENCOUNTER — Encounter (HOSPITAL_BASED_OUTPATIENT_CLINIC_OR_DEPARTMENT_OTHER): Payer: Self-pay | Admitting: Urology

## 2016-12-23 ENCOUNTER — Emergency Department
Admission: EM | Admit: 2016-12-23 | Discharge: 2016-12-23 | Disposition: A | Discharge status: Home / Self Care | Payer: Medicare Other | Attending: Emergency Medicine | Admitting: Emergency Medicine

## 2016-12-23 ENCOUNTER — Encounter: Payer: Self-pay | Admitting: Emergency Medicine

## 2016-12-23 DIAGNOSIS — Z950 Presence of cardiac pacemaker: Secondary | ICD-10-CM | POA: Insufficient documentation

## 2016-12-23 DIAGNOSIS — S0340XA Sprain of jaw, unspecified side, initial encounter: Secondary | ICD-10-CM

## 2016-12-23 DIAGNOSIS — I2510 Atherosclerotic heart disease of native coronary artery without angina pectoris: Secondary | ICD-10-CM | POA: Insufficient documentation

## 2016-12-23 DIAGNOSIS — E039 Hypothyroidism, unspecified: Secondary | ICD-10-CM | POA: Insufficient documentation

## 2016-12-23 DIAGNOSIS — S0341XA Sprain of jaw, right side, initial encounter: Secondary | ICD-10-CM | POA: Insufficient documentation

## 2016-12-23 DIAGNOSIS — Y929 Unspecified place or not applicable: Secondary | ICD-10-CM | POA: Insufficient documentation

## 2016-12-23 DIAGNOSIS — Y939 Activity, unspecified: Secondary | ICD-10-CM | POA: Insufficient documentation

## 2016-12-23 DIAGNOSIS — J449 Chronic obstructive pulmonary disease, unspecified: Secondary | ICD-10-CM | POA: Insufficient documentation

## 2016-12-23 DIAGNOSIS — I10 Essential (primary) hypertension: Secondary | ICD-10-CM | POA: Insufficient documentation

## 2016-12-23 DIAGNOSIS — Z87891 Personal history of nicotine dependence: Secondary | ICD-10-CM | POA: Diagnosis not present

## 2016-12-23 DIAGNOSIS — Y999 Unspecified external cause status: Secondary | ICD-10-CM | POA: Insufficient documentation

## 2016-12-23 DIAGNOSIS — Z7982 Long term (current) use of aspirin: Secondary | ICD-10-CM | POA: Diagnosis not present

## 2016-12-23 DIAGNOSIS — X58XXXA Exposure to other specified factors, initial encounter: Secondary | ICD-10-CM | POA: Insufficient documentation

## 2016-12-23 DIAGNOSIS — Z79899 Other long term (current) drug therapy: Secondary | ICD-10-CM | POA: Insufficient documentation

## 2016-12-23 DIAGNOSIS — K0889 Other specified disorders of teeth and supporting structures: Secondary | ICD-10-CM | POA: Diagnosis present

## 2016-12-23 NOTE — ED Provider Notes (Signed)
[facility] Emergency Department Provider Note        Time seen: ----------------------------------------- 8:03 AM on 12/23/2016 -----------------------------------------    I have reviewed the triage vital signs and the nursing notes.   HISTORY   Chief Complaint Dental Pain    HPI Jack Castillo is a 83 y.o. male who presents to the ED for right upper jaw pain for 4 days. He has not had any injury. Pain is reportedly worse with chewing. Currently his pain is 1 out of 10. He denies fevers, chills, other complaints.   Past Medical History:  Diagnosis Date  . Arthritis   . B12 deficiency anemia   . Bilateral renal cysts    per ct 12/ 2016  . Bladder cancer ([place])   . BPH (benign prostatic hyperplasia)   . Chronic constipation   . COPD (chronic obstructive pulmonary disease) ([place])    per controlled --  exercises on treadmil without sob  . Coronary artery disease    cardiologist-  dr Ubaldo Glassing Jefm Bryant)  [place]  07-09-2015 (note in epic under Care everywhere tab)  s/p  cabg x7 early 1990s  and re-do cabg late 1990s  . First degree heart block   . History of hiatal hernia   . Hyperlipidemia   . Hypertension   . Hypothyroidism   . Incomplete right bundle branch block   . Lower urinary tract symptoms (LUTS)   . OSA on CPAP   . S/P CABG x 7    early 1990's  and re-do  late 1990s    There are no active problems to display for this patient.   Past Surgical History:  Procedure Laterality Date  . APPENDECTOMY  as teen  . CARDIOVASCULAR STRESS TEST  01-06-2015  DR FATH  (see results Care Everywhere tab)   Normal cardiolite, no ischemia , normal LV funciton and wall motion, ef 62%  . CATARACT EXTRACTION W/ INTRAOCULAR LENS  IMPLANT, BILATERAL  2011  . CHOLECYSTECTOMY  2002  . CORONARY ARTERY BYPASS GRAFT  early 1990's   in [place]   x7  . CORONARY ARTERY BYPASS GRAFT  late 1990's   re-do  . CYSTOSCOPY WITH BIOPSY N/A 11/28/2015   Procedure: BLADDER  WITH BIOPSY;  Surgeon: Carolan Clines, MD;  Location: [facility];  Service: Urology;  Laterality: N/A;  . HEMORRHOID SURGERY  yrs ago  . HIATAL HERNIA REPAIR  1970's  . TONSILLECTOMY  as child  . TRANSURETHRAL RESECTION OF BLADDER TUMOR WITH GYRUS (TURBT-GYRUS) N/A 09/25/2015   Procedure: TRANSURETHRAL RESECTION OF BLADDER TUMOR WITH GYRUS (TURBT-GYRUS) X 2, 3 CM RIGHT BLADDER BASE. RIGHT BLADDER TRIGONE 2 CM;  Surgeon: Carolan Clines, MD;  Location: [facility];  Service: Urology;  Laterality: N/A;    Allergies Codeine  Social History Social History  Substance Use Topics  . Smoking status: Former Smoker    Years: 26.00    Types: Cigarettes    Quit date: 09/22/1975  . Smokeless tobacco: Never Used  . Alcohol use 4.2 oz/week    7 Glasses of wine per week     Comment: one wine daily    Review of Systems Constitutional: Negative for fever. ENT: Positive for right-sided jaw pain Cardiovascular: Negative for chest pain. Respiratory: Negative for shortness of breath. Skin: Negative for rash. ____________________________________________   PHYSICAL EXAM:  VITAL SIGNS: ED Triage Vitals  Enc Vitals Group     BP 12/23/16 0750 (!) 148/71     Pulse Rate  12/23/16 0750 (!) 51     Resp 12/23/16 0750 16     Temp 12/23/16 0750 97.9 F (36.6 C)     Temp Source 12/23/16 0750 Oral     SpO2 12/23/16 0750 99 %     Weight 12/23/16 0750 186 lb (84.4 kg)     Height 12/23/16 0750 5\' 8"  (1.727 m)     Head Circumference --      Peak Flow --      Pain Score 12/23/16 0749 1     Pain Loc --      Pain Edu? --      Excl. in [place]? --     Constitutional: Alert and oriented. Well appearing and in no distress. Eyes: Conjunctivae are normal. PERRL. Normal extraocular movements. ENT   Head: Normocephalic and atraumatic.TMs are clear. No tenderness over the right temple   Nose: No congestion/rhinnorhea.   Mouth/Throat: Mucous membranes are moist.No  dental tenderness, fair dentition throughout. Right TMJ tenderness and pain   Neck: No stridor. Musculoskeletal: Nontender with normal range of motion in extremities. No lower extremity tenderness nor edema. Neurologic:  Normal speech and language. No gross focal neurologic deficits are appreciated.  Skin:  Skin is warm, dry and intact. No rash noted. Psychiatric: Mood and affect are normal. Speech and behavior are normal.  ____________________________________________  ED COURSE:  Pertinent labs & imaging results that were available during my care of the patient were reviewed by me and considered in my medical decision making (see chart for details). Patient presents for right-sided jaw pain, clinically he has TMJ and will require anti-inflammatory treatment only.   Procedures ____________________________________________  FINAL ASSESSMENT AND PLAN  TMJ  Plan: Patient had presented for jaw pain which is apparently related to TMJ. Encouraged to soft diet, assessment for grinding his teeth at night and anti-inflammatory medicine. He is stable for dental referral.   Earleen Newport, MD   Note: This note was generated in part or whole with voice recognition software. Voice recognition is usually quite accurate but there are transcription errors that can and very often do occur. I apologize for any typographical errors that were not detected and corrected.     Earleen Newport, MD 12/23/16 [phone]

## 2016-12-23 NOTE — ED Triage Notes (Addendum)
Right upper jaw pain x4 days, no injury , worse with chewing

## 2018-05-11 ENCOUNTER — Emergency Department
Admission: EM | Admit: 2018-05-11 | Discharge: 2018-05-11 | Disposition: A | Discharge status: Home / Self Care | Payer: Medicare Other | Attending: Emergency Medicine | Admitting: Emergency Medicine

## 2018-05-11 ENCOUNTER — Encounter: Payer: Self-pay | Admitting: Emergency Medicine

## 2018-05-11 ENCOUNTER — Other Ambulatory Visit: Payer: Self-pay

## 2018-05-11 DIAGNOSIS — Z87891 Personal history of nicotine dependence: Secondary | ICD-10-CM | POA: Insufficient documentation

## 2018-05-11 DIAGNOSIS — Z7982 Long term (current) use of aspirin: Secondary | ICD-10-CM | POA: Diagnosis not present

## 2018-05-11 DIAGNOSIS — Y939 Activity, unspecified: Secondary | ICD-10-CM | POA: Diagnosis not present

## 2018-05-11 DIAGNOSIS — S51801A Unspecified open wound of right forearm, initial encounter: Secondary | ICD-10-CM | POA: Insufficient documentation

## 2018-05-11 DIAGNOSIS — Y999 Unspecified external cause status: Secondary | ICD-10-CM | POA: Insufficient documentation

## 2018-05-11 DIAGNOSIS — Z79899 Other long term (current) drug therapy: Secondary | ICD-10-CM | POA: Diagnosis not present

## 2018-05-11 DIAGNOSIS — Y92531 Health care provider office as the place of occurrence of the external cause: Secondary | ICD-10-CM | POA: Insufficient documentation

## 2018-05-11 DIAGNOSIS — I10 Essential (primary) hypertension: Secondary | ICD-10-CM | POA: Diagnosis not present

## 2018-05-11 DIAGNOSIS — Z951 Presence of aortocoronary bypass graft: Secondary | ICD-10-CM | POA: Diagnosis not present

## 2018-05-11 DIAGNOSIS — J449 Chronic obstructive pulmonary disease, unspecified: Secondary | ICD-10-CM | POA: Insufficient documentation

## 2018-05-11 DIAGNOSIS — S51811A Laceration without foreign body of right forearm, initial encounter: Secondary | ICD-10-CM

## 2018-05-11 DIAGNOSIS — E039 Hypothyroidism, unspecified: Secondary | ICD-10-CM | POA: Diagnosis not present

## 2018-05-11 DIAGNOSIS — I2510 Atherosclerotic heart disease of native coronary artery without angina pectoris: Secondary | ICD-10-CM | POA: Diagnosis not present

## 2018-05-11 DIAGNOSIS — X58XXXA Exposure to other specified factors, initial encounter: Secondary | ICD-10-CM | POA: Diagnosis not present

## 2018-05-11 NOTE — ED Notes (Signed)
See triage note  Presents with small open area to right elbow  States he had surgery on Monday  And he hit his arm on a chair   Small dressing removed  Small laceration noted

## 2018-05-11 NOTE — Discharge Instructions (Addendum)
Your skin tear is too old to repair with any skin closure. advised bulky dressing to area for the next 2 days.  Remove dressing by moistening after 2 days.  Re-bandage with Band-Aids.

## 2018-05-11 NOTE — ED Notes (Signed)
Non stick dressing to left elbow.

## 2018-05-11 NOTE — ED Triage Notes (Signed)
Pt here with c/o skin tear on right forearm that happened this past Monday while at Dr appointment, site with gauze and tape, pt tried to remove bandage today and is concerned it might need stitches, no bleeding noted on gauze.

## 2018-05-11 NOTE — ED Provider Notes (Signed)
[facility] Emergency Department Provider Note   ____________________________________________   First MD Initiated Contact with Patient 05/11/18 1338     (approximate)  I have reviewed the triage vital signs and the nursing notes.   HISTORY  Chief Complaint No chief complaint on file.    HPI Jack Castillo is a 84 y.o. male patient presents with a skin tear 3 days ago.  Patient state incident occurred at a doctor's office and they put gauze and tape over the area.  Patient is scheduled to remove it because increased risk of bleeding.  Patient is not taking blood thinners.  Patient denies pain.   Past Medical History:  Diagnosis Date  . Arthritis   . B12 deficiency anemia   . Bilateral renal cysts    per ct 12/ 2016  . Bladder cancer ([place])   . BPH (benign prostatic hyperplasia)   . Chronic constipation   . COPD (chronic obstructive pulmonary disease) ([place])    per controlled --  exercises on treadmil without sob  . Coronary artery disease    cardiologist-  dr Ubaldo Glassing Jefm Bryant)  [place]  07-09-2015 (note in epic under Care everywhere tab)  s/p  cabg x7 early 1990s  and re-do cabg late 1990s  . First degree heart block   . History of hiatal hernia   . Hyperlipidemia   . Hypertension   . Hypothyroidism   . Incomplete right bundle branch block   . Lower urinary tract symptoms (LUTS)   . OSA on CPAP   . S/P CABG x 7    early 1990's  and re-do  late 1990s    There are no active problems to display for this patient.   Past Surgical History:  Procedure Laterality Date  . APPENDECTOMY  as teen  . CARDIOVASCULAR STRESS TEST  01-06-2015  DR FATH  (see results Care Everywhere tab)   Normal cardiolite, no ischemia , normal LV funciton and wall motion, ef 62%  . CATARACT EXTRACTION W/ INTRAOCULAR LENS  IMPLANT, BILATERAL  2011  . CHOLECYSTECTOMY  2002  . CORONARY ARTERY BYPASS GRAFT  early 1990's   in [place]   x7  . CORONARY ARTERY BYPASS GRAFT  late  1990's   re-do  . CYSTOSCOPY WITH BIOPSY N/A 11/28/2015   Procedure: BLADDER WITH BIOPSY;  Surgeon: Carolan Clines, MD;  Location: [facility];  Service: Urology;  Laterality: N/A;  . HEMORRHOID SURGERY  yrs ago  . HIATAL HERNIA REPAIR  1970's  . TONSILLECTOMY  as child  . TRANSURETHRAL RESECTION OF BLADDER TUMOR WITH GYRUS (TURBT-GYRUS) N/A 09/25/2015   Procedure: TRANSURETHRAL RESECTION OF BLADDER TUMOR WITH GYRUS (TURBT-GYRUS) X 2, 3 CM RIGHT BLADDER BASE. RIGHT BLADDER TRIGONE 2 CM;  Surgeon: Carolan Clines, MD;  Location: [facility];  Service: Urology;  Laterality: N/A;    Prior to Admission medications   Medication Sig Start Date End Date Taking? Authorizing Provider  albuterol (PROVENTIL, VENTOLIN) (5 MG/ML) 0.5% NEBU Take 2.5 mg/hr by nebulization 2 (two) times daily.    [provider]  aspirin EC 81 MG tablet Take 81 mg by mouth daily.    [provider]  atorvastatin (LIPITOR) 80 MG tablet Take 80 mg by mouth every evening.    [provider]  budesonide-formoterol (SYMBICORT) 160-4.5 MCG/ACT inhaler Inhale 2 puffs into the lungs 2 (two) times daily.    [provider]  Coenzyme Q10 (COQ10) 100 MG CAPS Take by mouth daily.  [provider]  docusate sodium (COLACE) 100 MG capsule Take 100 mg by mouth 2 (two) times daily.    [provider]  irbesartan-hydrochlorothiazide (AVALIDE) 150-12.5 MG tablet Take 1 tablet by mouth every morning.    [provider]  levothyroxine (SYNTHROID, LEVOTHROID) 100 MCG tablet Take 100 mcg by mouth daily before breakfast.    [provider]  meloxicam (MOBIC) 15 MG tablet Take 1 tablet (15 mg total) by mouth daily. 09/25/15   Carolan Clines, MD  meloxicam (MOBIC) 15 MG tablet Take 1 tablet (15 mg total) by mouth daily. 11/28/15   Carolan Clines, MD  Methen-Hyosc-Meth Blue-Na Phos (UROGESIC-BLUE) 81.6 MG TABS Take 1 tablet 2-4 x/day  as needed for urinary spasm or  burning 09/25/15   Carolan Clines, MD  Misc Natural Products (OSTEO BI-FLEX JOINT SHIELD) TABS Take 2 tablets by mouth every evening.    [provider]  NIFEdipine (PROCARDIA-XL/ADALAT-CC/NIFEDICAL-XL) 30 MG 24 hr tablet Take 30 mg by mouth every morning.    [provider]  phenazopyridine (PYRIDIUM) 200 MG tablet Take 1 tablet (200 mg total) by mouth 3 (three) times daily as needed for pain. 11/28/15   Carolan Clines, MD    Allergies Codeine  No family history on file.  Social History Social History   Tobacco Use  . Smoking status: Former Smoker    Years: 26.00    Types: Cigarettes    Last attempt to quit: 09/22/1975    Years since quitting: 42.6  . Smokeless tobacco: Never Used  Substance Use Topics  . Alcohol use: Yes    Alcohol/week: 7.0 standard drinks    Types: 7 Glasses of wine per week    Comment: one wine daily  . Drug use: No    Review of Systems Constitutional: No fever/chills Eyes: No visual changes. ENT: No sore throat. Cardiovascular: Denies chest pain. Respiratory: Denies shortness of breath. Gastrointestinal: No abdominal pain.  No nausea, no vomiting.  No diarrhea.  No constipation. Genitourinary: Negative for dysuria. Musculoskeletal: Negative for back pain. Skin: Skin tear right forearm. Neurological: Negative for headaches, focal weakness or numbness. Endocrine: Hypothyroidism and hyperlipidemia.   Hypertension.Hematological/Lymphatic: Allergic/Immunilogical: Codeine. ____________________________________________   PHYSICAL EXAM:  VITAL SIGNS: ED Triage Vitals  Enc Vitals Group     BP 05/11/18 1249 (!) 145/54     Pulse Rate 05/11/18 1249 (!) 55     Resp 05/11/18 1249 18     Temp 05/11/18 1249 97.9 F (36.6 C)     Temp Source 05/11/18 1249 Oral     SpO2 05/11/18 1249 98 %     Weight 05/11/18 1250 190 lb (86.2 kg)     Height 05/11/18 1250 5\' 7"  (1.702 m)     Head Circumference --       Peak Flow --      Pain Score 05/11/18 1250 0     Pain Loc --      Pain Edu? --      Excl. in [place]? --     Constitutional: Alert and oriented. Well appearing and in no acute distress. Cardiovascular: Normal rate, regular rhythm. Grossly normal heart sounds.  Good peripheral circulation. Respiratory: Normal respiratory effort.  No retractions. Lungs CTAB. Neurologic:  Normal speech and language. No gross focal neurologic deficits are appreciated. No gait instability. Skin: Skin tear right forearm with no active bleeding.  No signs or symptoms of  infection. Psychiatric: Mood and affect are normal. Speech and behavior are normal.  ____________________________________________  LABS (all labs ordered are listed, but only abnormal results are displayed)  Labs Reviewed - No data to display ____________________________________________  EKG   ____________________________________________  RADIOLOGY  ED MD interpretation:    Official radiology report(s): No results found.  ____________________________________________   PROCEDURES  Procedure(s) performed: None  Procedures  Critical Care performed: No  ____________________________________________   INITIAL IMPRESSION / ASSESSMENT AND PLAN / ED COURSE  As part of my medical decision making, I reviewed the following data within the electronic MEDICAL RECORD NUMBER    Healing skin tear to right forearm.  Area was cleaned and re-bandaged.  Patient given discharge care instructions.      ____________________________________________   FINAL CLINICAL IMPRESSION(S) / ED DIAGNOSES  Final diagnoses:  Skin tear of right forearm without complication, initial encounter     ED Discharge Orders    None       Note:  This document was prepared using Dragon voice recognition software and may include unintentional dictation errors.    Sable Feil, PA-C 05/11/18 1353    Darel Hong, MD 05/11/18 2103

## 2018-10-12 ENCOUNTER — Ambulatory Visit
Admission: RE | Admit: 2018-10-12 | Discharge: 2018-10-12 | Disposition: A | Discharge status: Home / Self Care | Payer: Medicare Other | Source: Ambulatory Visit | Attending: Pediatrics | Admitting: Pediatrics

## 2018-10-12 ENCOUNTER — Other Ambulatory Visit: Payer: Self-pay | Admitting: Pediatrics

## 2018-10-12 ENCOUNTER — Ambulatory Visit
Admission: RE | Admit: 2018-10-12 | Discharge: 2018-10-12 | Disposition: A | Discharge status: Home / Self Care | Payer: Medicare Other | Attending: Pediatrics | Admitting: Pediatrics

## 2018-10-12 DIAGNOSIS — R600 Localized edema: Secondary | ICD-10-CM

## 2018-10-12 DIAGNOSIS — C670 Malignant neoplasm of trigone of bladder: Secondary | ICD-10-CM | POA: Diagnosis present

## 2018-10-12 DIAGNOSIS — J449 Chronic obstructive pulmonary disease, unspecified: Secondary | ICD-10-CM

## 2018-10-12 DIAGNOSIS — R0602 Shortness of breath: Secondary | ICD-10-CM

## 2019-01-08 ENCOUNTER — Other Ambulatory Visit (HOSPITAL_COMMUNITY): Payer: Self-pay | Admitting: Surgical Oncology

## 2019-01-08 ENCOUNTER — Other Ambulatory Visit: Payer: Self-pay | Admitting: Surgical Oncology

## 2019-01-08 DIAGNOSIS — C679 Malignant neoplasm of bladder, unspecified: Secondary | ICD-10-CM

## 2019-01-23 ENCOUNTER — Ambulatory Visit
Admission: RE | Admit: 2019-01-23 | Discharge: 2019-01-23 | Disposition: A | Discharge status: Home / Self Care | Payer: Medicare Other | Source: Ambulatory Visit | Attending: Surgical Oncology | Admitting: Surgical Oncology

## 2019-01-23 ENCOUNTER — Other Ambulatory Visit: Payer: Self-pay

## 2019-01-23 DIAGNOSIS — C679 Malignant neoplasm of bladder, unspecified: Secondary | ICD-10-CM | POA: Diagnosis present

## 2019-01-23 MED ORDER — IOHEXOL 300 MG/ML  SOLN
75.0000 mL | Freq: Once | INTRAMUSCULAR | Status: AC | PRN
  Administered 2019-01-23: 75 mL via INTRAVENOUS

## 2019-06-09 ENCOUNTER — Emergency Department
Admission: EM | Admit: 2019-06-09 | Discharge: 2019-06-10 | Disposition: A | Discharge status: Other Acute Inpatient Hospital | Payer: Medicare Other | Attending: Emergency Medicine | Admitting: Emergency Medicine

## 2019-06-09 ENCOUNTER — Other Ambulatory Visit: Payer: Self-pay

## 2019-06-09 ENCOUNTER — Emergency Department: Payer: Medicare Other

## 2019-06-09 ENCOUNTER — Encounter: Payer: Self-pay | Admitting: Emergency Medicine

## 2019-06-09 DIAGNOSIS — E039 Hypothyroidism, unspecified: Secondary | ICD-10-CM | POA: Insufficient documentation

## 2019-06-09 DIAGNOSIS — I2510 Atherosclerotic heart disease of native coronary artery without angina pectoris: Secondary | ICD-10-CM | POA: Diagnosis not present

## 2019-06-09 DIAGNOSIS — Z20828 Contact with and (suspected) exposure to other viral communicable diseases: Secondary | ICD-10-CM | POA: Diagnosis not present

## 2019-06-09 DIAGNOSIS — D696 Thrombocytopenia, unspecified: Secondary | ICD-10-CM | POA: Insufficient documentation

## 2019-06-09 DIAGNOSIS — R1084 Generalized abdominal pain: Secondary | ICD-10-CM

## 2019-06-09 DIAGNOSIS — R109 Unspecified abdominal pain: Secondary | ICD-10-CM | POA: Diagnosis present

## 2019-06-09 DIAGNOSIS — C7911 Secondary malignant neoplasm of bladder: Secondary | ICD-10-CM | POA: Diagnosis not present

## 2019-06-09 DIAGNOSIS — N183 Chronic kidney disease, stage 3 (moderate): Secondary | ICD-10-CM | POA: Diagnosis not present

## 2019-06-09 DIAGNOSIS — J449 Chronic obstructive pulmonary disease, unspecified: Secondary | ICD-10-CM | POA: Insufficient documentation

## 2019-06-09 DIAGNOSIS — I129 Hypertensive chronic kidney disease with stage 1 through stage 4 chronic kidney disease, or unspecified chronic kidney disease: Secondary | ICD-10-CM | POA: Diagnosis not present

## 2019-06-09 DIAGNOSIS — Z87891 Personal history of nicotine dependence: Secondary | ICD-10-CM | POA: Insufficient documentation

## 2019-06-09 DIAGNOSIS — K56609 Unspecified intestinal obstruction, unspecified as to partial versus complete obstruction: Secondary | ICD-10-CM | POA: Diagnosis not present

## 2019-06-09 DIAGNOSIS — Z7982 Long term (current) use of aspirin: Secondary | ICD-10-CM | POA: Diagnosis not present

## 2019-06-09 DIAGNOSIS — Z79899 Other long term (current) drug therapy: Secondary | ICD-10-CM | POA: Insufficient documentation

## 2019-06-09 DIAGNOSIS — Z951 Presence of aortocoronary bypass graft: Secondary | ICD-10-CM | POA: Diagnosis not present

## 2019-06-09 DIAGNOSIS — R112 Nausea with vomiting, unspecified: Secondary | ICD-10-CM

## 2019-06-09 LAB — COMPREHENSIVE METABOLIC PANEL
ALT: 28 U/L (ref 0–44)
AST: 40 U/L (ref 15–41)
Albumin: 3.2 g/dL — ABNORMAL LOW (ref 3.5–5.0)
Alkaline Phosphatase: 89 U/L (ref 38–126)
Anion gap: 15 (ref 5–15)
BUN: 37 mg/dL — ABNORMAL HIGH (ref 8–23)
CO2: 25 mmol/L (ref 22–32)
Calcium: 8.4 mg/dL — ABNORMAL LOW (ref 8.9–10.3)
Chloride: 96 mmol/L — ABNORMAL LOW (ref 98–111)
Creatinine, Ser: 2.2 mg/dL — ABNORMAL HIGH (ref 0.61–1.24)
GFR calc Af Amer: 30 mL/min — ABNORMAL LOW (ref >60)
GFR calc non Af Amer: 26 mL/min — ABNORMAL LOW (ref >60)
Glucose, Bld: 139 mg/dL — ABNORMAL HIGH (ref 70–99)
Potassium: 4.2 mmol/L (ref 3.5–5.1)
Sodium: 136 mmol/L (ref 135–145)
Total Bilirubin: 1.1 mg/dL (ref 0.3–1.2)
Total Protein: 5.7 g/dL — ABNORMAL LOW (ref 6.5–8.1)

## 2019-06-09 LAB — CBC
HCT: 33.8 % — ABNORMAL LOW (ref 39.0–52.0)
Hemoglobin: 11.5 g/dL — ABNORMAL LOW (ref 13.0–17.0)
MCH: 33.4 pg (ref 26.0–34.0)
MCHC: 34 g/dL (ref 30.0–36.0)
MCV: 98.3 fL (ref 80.0–100.0)
Platelets: 58 10*3/uL — ABNORMAL LOW (ref 150–400)
RBC: 3.44 MIL/uL — ABNORMAL LOW (ref 4.22–5.81)
RDW: 14.3 % (ref 11.5–15.5)
WBC: 5.2 10*3/uL (ref 4.0–10.5)
nRBC: 0 % (ref 0.0–0.2)

## 2019-06-09 LAB — LIPASE, BLOOD: Lipase: 612 U/L — ABNORMAL HIGH (ref 11–51)

## 2019-06-09 LAB — TROPONIN I (HIGH SENSITIVITY): Troponin I (High Sensitivity): 21 ng/L — ABNORMAL HIGH (ref <18)

## 2019-06-09 NOTE — ED Notes (Signed)
Patient brought in by ems. Patient states that he has abdominal pain with distention and vomiting times one hour. Patient also reports that he is having some shortness of breath.

## 2019-06-09 NOTE — ED Notes (Signed)
Pt arrived via EMS to stat registration via wheelchair with c/o generalized abd pain x 1 hour; abd distended; lower ext peripheral edema noted; was given Zofran IV in route but pt is currently vomiting while waiting for triage;

## 2019-06-10 ENCOUNTER — Emergency Department: Payer: Medicare Other

## 2019-06-10 DIAGNOSIS — K56609 Unspecified intestinal obstruction, unspecified as to partial versus complete obstruction: Secondary | ICD-10-CM | POA: Diagnosis not present

## 2019-06-10 LAB — SARS CORONAVIRUS 2 (TAT 6-24 HRS): SARS Coronavirus 2: NEGATIVE

## 2019-06-10 MED ORDER — IOHEXOL 12 MG/ML PO SOLN
500.0000 mL | ORAL | Status: AC
  Administered 2019-06-10 (×2): 500 mL via ORAL

## 2019-06-10 MED ORDER — ONDANSETRON HCL 4 MG/2ML IJ SOLN
4.0000 mg | Freq: Once | INTRAMUSCULAR | Status: AC
  Administered 2019-06-10: 4 mg via INTRAVENOUS
  Filled 2019-06-10: qty 2

## 2019-06-10 MED ORDER — FENTANYL CITRATE (PF) 100 MCG/2ML IJ SOLN
50.0000 ug | Freq: Once | INTRAMUSCULAR | Status: AC
  Administered 2019-06-10: 50 ug via INTRAVENOUS
  Filled 2019-06-10: qty 2

## 2019-06-10 NOTE — ED Provider Notes (Signed)
[facility] Emergency Department Provider Note   ____________________________________________   First MD Initiated Contact with Patient 06/10/19 0017     (approximate)  I have reviewed the triage vital signs and the nursing notes.   HISTORY  Chief Complaint Abdominal Pain and Emesis    HPI Jack Castillo is a 85 y.o. male brought to the ED via EMS from home with a chief complaint of abdominal pain, distention and vomiting x 1 hour. Also reports some SOB. 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 Denies fever, cough, chest pain, dysuria, constipation.         Past Medical History:  Diagnosis Date   Arthritis    B12 deficiency anemia    Bilateral renal cysts    per ct 12/ 2016   Bladder cancer (HCC)    BPH (benign prostatic hyperplasia)    Chronic constipation    COPD (chronic obstructive pulmonary disease) (HCC)    per controlled --  exercises on treadmil without sob   Coronary artery disease    cardiologist-  dr Ubaldo Glassing Jefm Bryant)  [place]  07-09-2015 (note in epic under Care everywhere tab)  s/p  cabg x7 early 1990s  and re-do cabg late 1990s   First degree heart block    History of hiatal hernia    Hyperlipidemia    Hypertension    Hypothyroidism    Incomplete right bundle branch block    Lower urinary tract symptoms (LUTS)    OSA on CPAP    S/P CABG x 7    early 1990's  and re-do  late 1990s    There are no active problems to display for this patient.   Past Surgical History:  Procedure Laterality Date   APPENDECTOMY  as teen   CARDIOVASCULAR STRESS TEST  01-06-2015  DR Ubaldo Glassing  (see results Care Everywhere tab)   Normal cardiolite, no ischemia , normal LV funciton and wall motion, ef 62%   CATARACT EXTRACTION W/ INTRAOCULAR LENS   IMPLANT, BILATERAL  2011   CHOLECYSTECTOMY  2002   CORONARY ARTERY BYPASS GRAFT  early 1990's   in [place]   x7   CORONARY ARTERY BYPASS GRAFT  late 1990's   re-do   CYSTOSCOPY WITH BIOPSY N/A 11/28/2015   Procedure: BLADDER WITH BIOPSY;  Surgeon: Carolan Clines, MD;  Location: [facility];  Service: Urology;  Laterality: N/A;   HEMORRHOID SURGERY  yrs ago   HIATAL HERNIA REPAIR  1970's   TONSILLECTOMY  as child   TRANSURETHRAL RESECTION OF BLADDER TUMOR WITH GYRUS (TURBT-GYRUS) N/A 09/25/2015   Procedure: TRANSURETHRAL RESECTION OF BLADDER TUMOR WITH GYRUS (TURBT-GYRUS) X 2, 3 CM RIGHT BLADDER BASE. RIGHT BLADDER TRIGONE 2 CM;  Surgeon: Carolan Clines, MD;  Location: [facility];  Service: Urology;  Laterality: N/A;    Prior to Admission medications   Medication Sig Start Date End Date Taking? Authorizing Provider  albuterol (PROVENTIL, VENTOLIN) (5 MG/ML) 0.5% NEBU Take 2.5 mg/hr by nebulization 2 (two) times daily.    [provider]  aspirin EC 81 MG tablet Take 81 mg by mouth daily.    [provider]  atorvastatin (LIPITOR) 80 MG tablet Take 80 mg by mouth every evening.    [provider]  budesonide-formoterol (SYMBICORT) 160-4.5 MCG/ACT inhaler Inhale 2 puffs into the lungs 2 (two) times  daily.    [provider]  Coenzyme Q10 (COQ10) 100 MG CAPS Take by mouth daily.    [provider]  docusate sodium (COLACE) 100 MG capsule Take 100 mg by mouth 2 (two) times daily.    [provider]  irbesartan-hydrochlorothiazide (AVALIDE) 150-12.5 MG tablet Take 1 tablet by mouth every morning.    [provider]  levothyroxine (SYNTHROID, LEVOTHROID) 100 MCG tablet Take 100 mcg by mouth daily before breakfast.    [provider]  meloxicam (MOBIC) 15 MG tablet Take 1 tablet (15 mg total) by mouth daily. 09/25/15   Carolan Clines, MD  meloxicam (MOBIC) 15 MG tablet Take 1  tablet (15 mg total) by mouth daily. 11/28/15   Carolan Clines, MD  Methen-Hyosc-Meth Blue-Na Phos (UROGESIC-BLUE) 81.6 MG TABS Take 1 tablet 2-4 x/day as needed for urinary spasm or  burning 09/25/15   Carolan Clines, MD  Misc Natural Products (OSTEO BI-FLEX JOINT SHIELD) TABS Take 2 tablets by mouth every evening.    [provider]  NIFEdipine (PROCARDIA-XL/ADALAT-CC/NIFEDICAL-XL) 30 MG 24 hr tablet Take 30 mg by mouth every morning.    [provider]  phenazopyridine (PYRIDIUM) 200 MG tablet Take 1 tablet (200 mg total) by mouth 3 (three) times daily as needed for pain. 11/28/15   Carolan Clines, MD    Allergies Codeine  No family history on file.  Social History Social History   Tobacco Use   Smoking status: Former Smoker    Years: 26.00    Types: Cigarettes    Quit date: 09/22/1975    Years since quitting: 43.7   Smokeless tobacco: Never Used  Substance Use Topics   Alcohol use: Yes    Alcohol/week: 14.0 standard drinks    Types: 14 Glasses of wine per week    Comment: one wine daily   Drug use: No    Review of Systems  Constitutional: No fever/chills Eyes: No visual changes. ENT: No sore throat. Cardiovascular: Denies chest pain. Respiratory: Denies shortness of breath. Gastrointestinal: Positive for abdominal pain, nausea and vomiting.  No diarrhea.  No constipation. Genitourinary: Negative for dysuria. Musculoskeletal: Negative for back pain. Skin: Negative for rash. Neurological: Negative for headaches, focal weakness or numbness.   ____________________________________________   PHYSICAL EXAM:  VITAL SIGNS: ED Triage Vitals  Enc Vitals Group     BP 06/09/19 2155 (!) 146/68     Pulse Rate 06/09/19 2155 72     Resp 06/09/19 2155 (!) 22     Temp 06/09/19 2155 98.4 F (36.9 C)     Temp Source 06/09/19 2155 Oral     SpO2 06/09/19 2155 96 %     Weight 06/09/19 2157 195 lb (88.5 kg)     Height 06/09/19 2157 5\' 6"   (1.676 m)     Head Circumference --      Peak Flow --      Pain Score 06/09/19 2157 7     Pain Loc --      Pain Edu? --      Excl. in [place]? --     Constitutional: Alert and oriented. Uncomfortable appearing and in mild acute distress. Eyes: Conjunctivae are normal. PERRL. EOMI. Head: Atraumatic. Nose: No congestion/rhinnorhea. Mouth/Throat: Mucous membranes are moist.  Oropharynx non-erythematous. Neck: No stridor.   Cardiovascular: Normal rate, regular rhythm. Grossly normal heart sounds.  Good peripheral circulation. Respiratory: Normal respiratory effort.  No retractions. Lungs CTAB. Gastrointestinal: Mild to moderate distention with diffuse tenderness to palpation. No abdominal  bruits. No CVA tenderness. Musculoskeletal: No lower extremity tenderness nor edema.  No joint effusions. Neurologic:  Normal speech and language. No gross focal neurologic deficits are appreciated.  Skin:  Skin is warm, dry and intact. No rash noted. Psychiatric: Mood and affect are normal. Speech and behavior are normal.  ____________________________________________   LABS (all labs ordered are listed, but only abnormal results are displayed)  Labs Reviewed  LIPASE, BLOOD - Abnormal; Notable for the following components:      Result Value   Lipase 612 (*)    All other components within normal limits  COMPREHENSIVE METABOLIC PANEL - Abnormal; Notable for the following components:   Chloride 96 (*)    Glucose, Bld 139 (*)    BUN 37 (*)    Creatinine, Ser 2.20 (*)    Calcium 8.4 (*)    Total Protein 5.7 (*)    Albumin 3.2 (*)    GFR calc non Af Amer 26 (*)    GFR calc Af Amer 30 (*)    All other components within normal limits  CBC - Abnormal; Notable for the following components:   RBC 3.44 (*)    Hemoglobin 11.5 (*)    HCT 33.8 (*)    Platelets 58 (*)    All other components within normal limits  TROPONIN I (HIGH SENSITIVITY) - Abnormal; Notable for the following components:   Troponin I  (High Sensitivity) 21 (*)    All other components within normal limits  SARS CORONAVIRUS 2 (TAT 6-24 HRS)  URINALYSIS, COMPLETE (UACMP) WITH MICROSCOPIC  TROPONIN I (HIGH SENSITIVITY)   ____________________________________________  EKG  ED ECG REPORT I, Patrici Minnis J, the attending physician, personally viewed and interpreted this ECG.   Date: 06/10/2019  EKG Time: 2200  Rate: 66  Rhythm: normal EKG, normal sinus rhythm  Axis: Normal  Intervals:none  ST&T Change: Nonspecific ____________________________________________  RADIOLOGY  ED MD interpretation:  Bibasilar chest tubes in place; no effusion; CT demonstartes proximal SBO to the level of the mid ileum  Official radiology report(s): Ct Abdomen Pelvis Wo Contrast  Result Date: 06/10/2019 CLINICAL DATA:  Abdominal pain and vomiting EXAM: CT ABDOMEN AND PELVIS WITHOUT CONTRAST TECHNIQUE: Multidetector CT imaging of the abdomen and pelvis was performed following the standard protocol without IV contrast. COMPARISON:  January 23, 2019 FINDINGS: Lower chest: There is cardiomegaly. Vascular calcifications are noted. There is a small left pleural effusion. Bilateral bibasilar drainage catheters are seen. Streaky atelectasis seen at the right lung base. A small hiatal hernia is present. Hepatobiliary: The liver is normal in density without focal abnormality.The main portal vein is patent. The patient is status post cholecystectomy. Pancreas:  There is fatty atrophy of the pancreas. Spleen: Normal in size without focal abnormality. Adrenals/Urinary Tract: Both adrenal glands appear normal. Bilateral low-density lesions are seen throughout both kidneys the largest measuring 5.4 cm in the midpole of the right kidney. There is a hyperdense lesion seen off the lower pole of the right kidney measuring 1 cm. This is unchanged from the prior exam. Stomach/Bowel: The stomach is contrast debris and air filled. The stomach appears to be dilated. There is mild  to moderately dilated duodenal, jejunal, and ileal loops down to the level the mid ileum or there is a focal area narrowing best seen on series 2, image 65. Food debris, fluid, and air seen throughout the dilated loops of small bowel. No focal area thickening however seen. Distal ileal loops and terminal ileum are decompressed. There is air  and stool seen down to the level of the rectum. Vascular/Lymphatic: There are no enlarged mesenteric, retroperitoneal, or pelvic lymph nodes. Scattered aortic atherosclerotic calcifications are seen without aneurysmal dilatation. Reproductive: The prostate is unremarkable. Other: There is a small to moderate amount of perihepatic some perisplenic ascites. A small amount of fluid seen within the perirectal soft tissues. Musculoskeletal: No acute or significant osseous findings. IMPRESSION: 1. Findings of proximal small bowel obstruction to the level of the mid ileum with a focal area of narrowing as described above. 2. Small to moderate amount of abdominal ascites 3. Bilateral pleural drainage catheters with a small left pleural effusion. 4. Hiatal hernia. Electronically Signed   By: Prudencio Pair M.D.   On: 06/10/2019 02:24   Dg Chest 2 View  Result Date: 06/09/2019 CLINICAL DATA:  Shortness of breath EXAM: CHEST - 2 VIEW COMPARISON:  10/12/2018 FINDINGS: Prior CABG. Cardiomegaly. Linear atelectasis or scarring in the lung bases. Bilateral basilar chest tubes. No pneumothorax or significant effusions. No acute bony abnormality. IMPRESSION: Bibasilar chest tubes in place without visible effusion or pneumothorax. Bibasilar scarring or atelectasis. Electronically Signed   By: Rolm Baptise M.D.   On: 06/09/2019 22:30    ____________________________________________   PROCEDURES  Procedure(s) performed (including Critical Care):  Procedures   ____________________________________________   INITIAL IMPRESSION / ASSESSMENT AND PLAN / ED COURSE  As part of my medical  decision making, I reviewed the following data within the [place] History obtained from family, Nursing notes reviewed and incorporated, Labs reviewed, EKG interpreted, Old chart reviewed, Radiograph reviewed and Notes from prior ED visits     JECOREY FUSILLO was evaluated in Emergency Department on 06/10/2019 for the symptoms described in the history of present illness. He was evaluated in the context of the global COVID-19 pandemic, which necessitated consideration that the patient might be at risk for infection with the SARS-CoV-2 virus that causes COVID-19. Institutional protocols and algorithms that pertain to the evaluation of patients at risk for COVID-19 are in a state of rapid change based on information released by regulatory bodies including the CDC and federal and state organizations. These policies and algorithms were followed during the patient's care in the ED.   85 year old male who presents with abdominal pain, distention, nausea and vomiting. Differential diagnosis includes, but is not limited to, biliary disease (biliary colic, acute cholecystitis, cholangitis, choledocholithiasis, etc), intrathoracic causes for epigastric abdominal pain including ACS, gastritis, duodenitis, pancreatitis, small bowel or large bowel obstruction, abdominal aortic aneurysm, hernia, and ulcer(s).  Laboratory results notable for renal insufficiency, elevated troponin and lipase with normal transaminases and t bili. Will proceed with CT w/ oral contrast only to evaluate etiology of patient's symptoms.  Clinical Course as of Jun 09 499  Sun Jun 10, 2019  0220 Patient vomited; could not tolerate PO contrast.   [JS]  0254 Updated patient and son of CT results. Patient received his care at [facility] and prefers to be transferred. Has not vomited since drinking PO contrast. Given his thrombocytopenia, will hold off on NGtube unless patient has active vomiting. Will call [facility] transfer center.    [JS]  [id number] Still awaiting call back from [facility]. Patient updated.   [JS]  [id number] Spoke with [facility] heme-oncology Dr. Ferrel Logan who advised speaking with their hospitalist for acceptance.   [JS]  [id number] Accepted by Dr. Jacinta Shoe hospitalist. Updated patient and his son. Will change him to inpatient bed for his comfort as it may be after  shift change for bed assignment.   [JS]    Clinical Course User Index [JS] Paulette Blanch, MD     ____________________________________________   FINAL CLINICAL IMPRESSION(S) / ED DIAGNOSES  Final diagnoses:  Nausea and vomiting, intractability of vomiting not specified, unspecified vomiting type  Generalized abdominal pain  SBO (small bowel obstruction) ([place])  Thrombocytopenia [facility])     ED Discharge Orders    None       Note:  This document was prepared using Dragon voice recognition software and may include unintentional dictation errors.   Paulette Blanch, MD 06/11/19 [phone]

## 2019-06-10 NOTE — ED Notes (Signed)
Patient updated that ED provider is talking with providers at [facility] to arrange a transfer.  Patient and family verbalize understanding.

## 2019-06-10 NOTE — ED Notes (Signed)
emtala reviewed by this RN 

## 2019-06-10 NOTE — ED Notes (Signed)
Images power shared by Merry Proud in [place]

## 2019-06-10 NOTE — ED Notes (Signed)
[facility] at bedside  

## 2019-07-05 DEATH — deceased

## 2020-02-19 IMAGING — CT CT ABD-PELV W/O CM
2 of 3 series · 16 of 46 positions shown, 18 images · non-contrast
Comparison: January 23, 2019

CLINICAL DATA: Abdominal pain and vomiting

EXAM:
CT ABDOMEN AND PELVIS WITHOUT CONTRAST
TECHNIQUE: Multidetector CT imaging of the abdomen and pelvis was performed
following the standard protocol without IV contrast.

[Series 2: routine abd/pel wo · axial · 0.86mm/px · z∈[-1192,-737]mm · 13 of 105 slices shown, 15 images]
[im 7/105  soft-tissue]
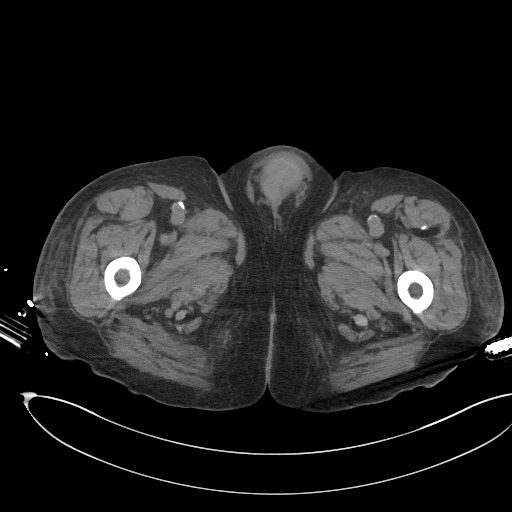
[im 7/105  bone]
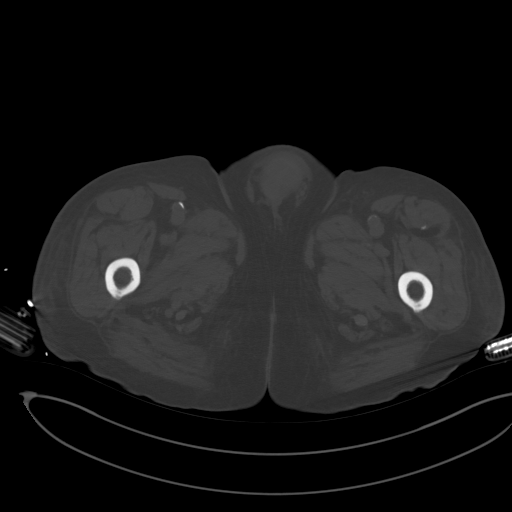
[im 14/105  soft-tissue]
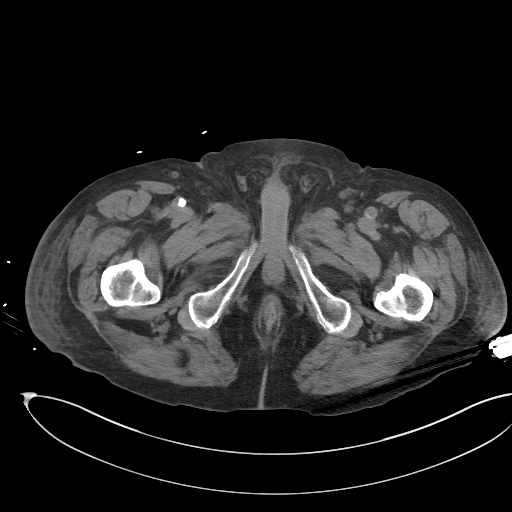
[im 21/105  soft-tissue]
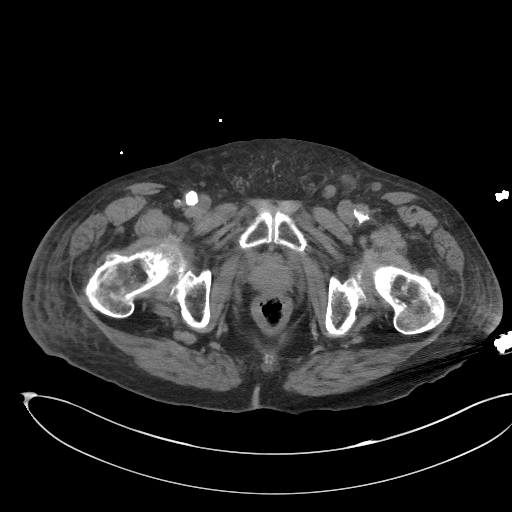
[im 31/105  soft-tissue]
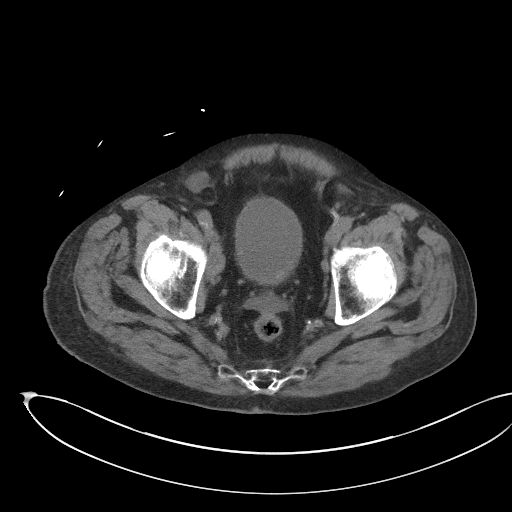
[im 37/105  soft-tissue]
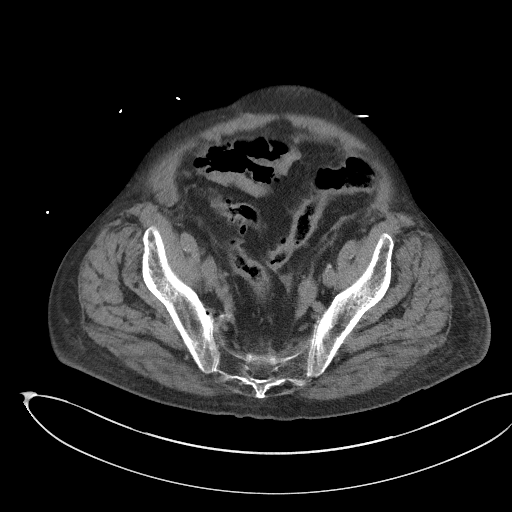
[im 44/105  soft-tissue]
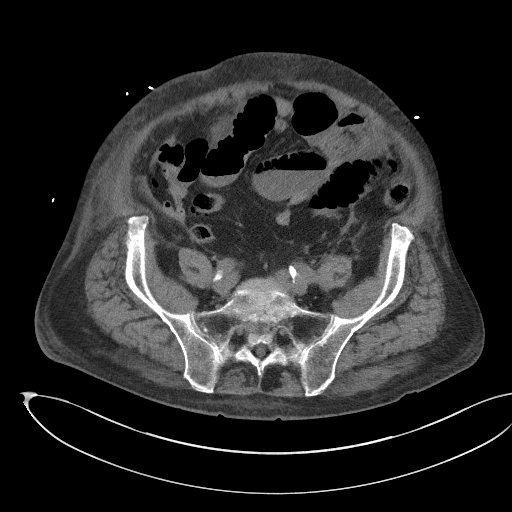
[im 54/105  soft-tissue]
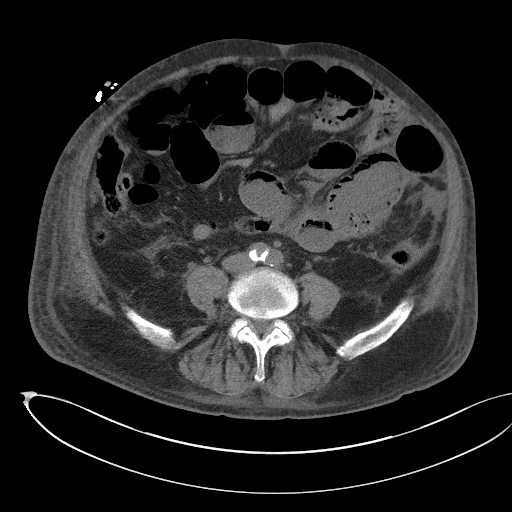
[im 61/105  soft-tissue]
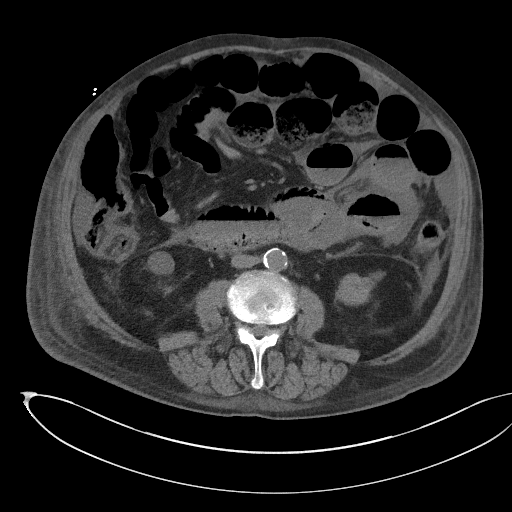
[im 68/105  soft-tissue]
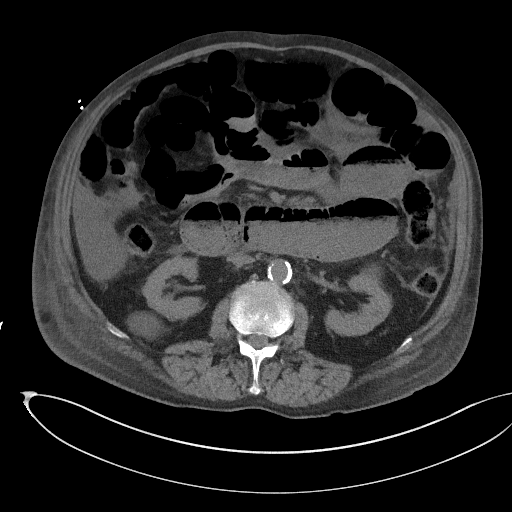
[im 68/105  bone]
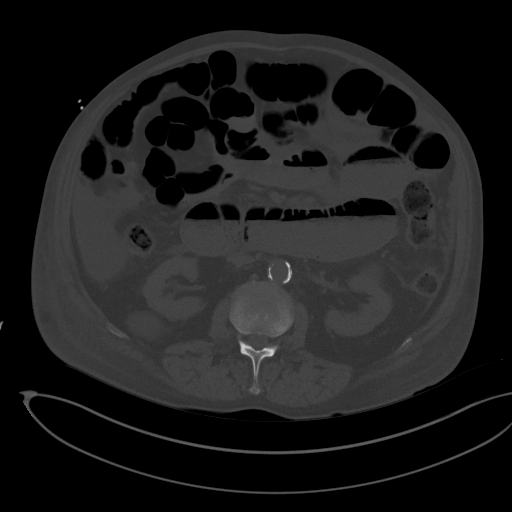
[im 74/105  soft-tissue]
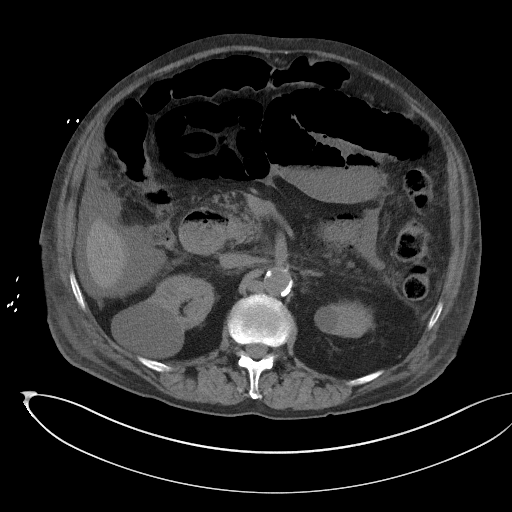
[im 84/105  soft-tissue]
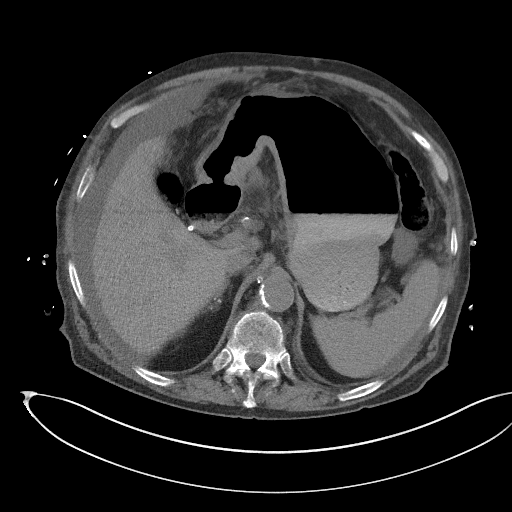
[im 91/105  soft-tissue]
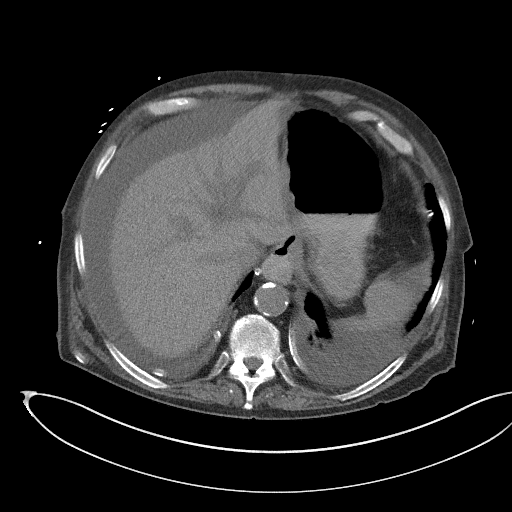
[im 98/105  soft-tissue]
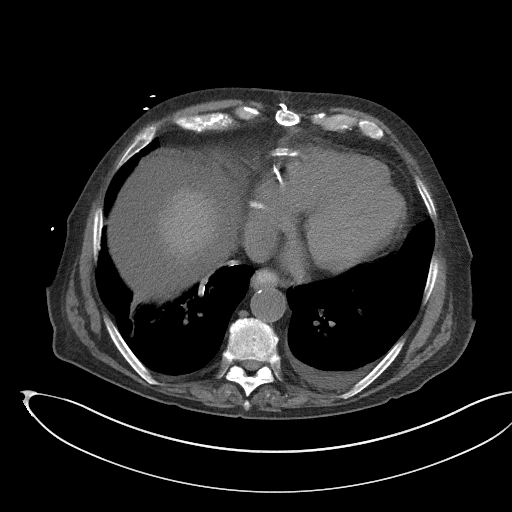

[Series 5: coronal st · coronal · 0.80mm/px · 3 of 110 slices shown]
[im 37/110  soft-tissue]
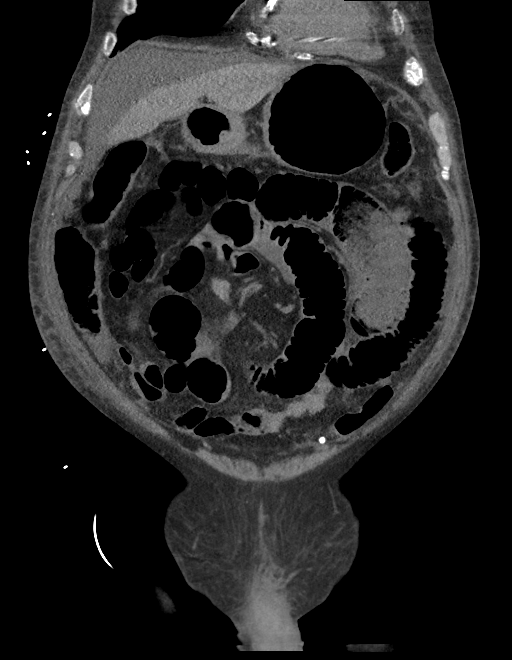
[im 49/110  soft-tissue]
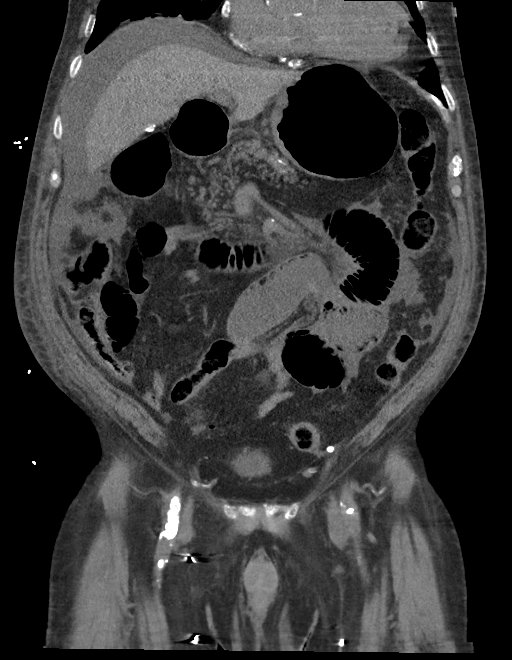
[im 61/110  soft-tissue]
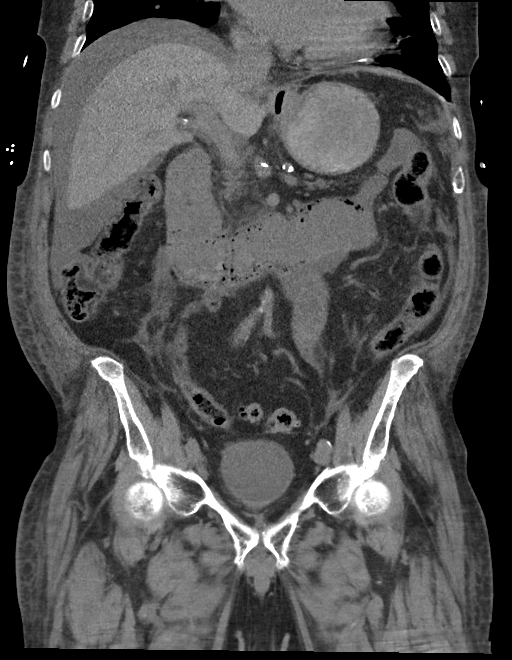

[16 of 46 positions shown; findings below may reference images not displayed]

FINDINGS: Lower chest: There is cardiomegaly. Vascular calcifications are
noted. There is a small left pleural effusion. Bilateral bibasilar
drainage catheters are seen. Streaky atelectasis seen at the right
lung base.

A small hiatal hernia is present.

Hepatobiliary: The liver is normal in density without focal
abnormality.The main portal vein is patent. The patient is status
post cholecystectomy.

Pancreas:  There is fatty atrophy of the pancreas.

Spleen: Normal in size without focal abnormality.

Adrenals/Urinary Tract: Both adrenal glands appear normal. Bilateral
low-density lesions are seen throughout both kidneys the largest
measuring 5.4 cm in the midpole of the right kidney. There is a
hyperdense lesion seen off the lower pole of the right kidney
measuring 1 cm. This is unchanged from the prior exam.

Stomach/Bowel: The stomach is contrast debris and air filled. The
stomach appears to be dilated. There is mild to moderately dilated
duodenal, jejunal, and ileal loops down to the level the mid ileum
or there is a focal area narrowing best seen on series 2, image 65.
Food debris, fluid, and air seen throughout the dilated loops of
small bowel. No focal area thickening however seen. Distal ileal
loops and terminal ileum are decompressed. There is air and stool
seen down to the level of the rectum.

Vascular/Lymphatic: There are no enlarged mesenteric,
retroperitoneal, or pelvic lymph nodes. Scattered aortic
atherosclerotic calcifications are seen without aneurysmal
dilatation.

Reproductive: The prostate is unremarkable.

Other: There is a small to moderate amount of perihepatic some
perisplenic ascites. A small amount of fluid seen within the
perirectal soft tissues.

Musculoskeletal: No acute or significant osseous findings.
IMPRESSION: 1. Findings of proximal small bowel obstruction to the level of the
mid ileum with a focal area of narrowing as described above.
2. Small to moderate amount of abdominal ascites
3. Bilateral pleural drainage catheters with a small left pleural
effusion.
4. Hiatal hernia.

## 2020-06-02 ENCOUNTER — Ambulatory Visit: Payer: Self-pay | Admitting: Internal Medicine

## 2020-06-02 NOTE — Progress Notes (Signed)
Rescheduled
